# Patient Record
Sex: Female | Born: 1965
Health system: Southern US, Community
[De-identification: ages and names within clinical notes are randomized; demographics above are authoritative.]

## PROBLEM LIST (undated history)

## (undated) DIAGNOSIS — K529 Noninfective gastroenteritis and colitis, unspecified: Secondary | ICD-10-CM

## (undated) DIAGNOSIS — G5751 Tarsal tunnel syndrome, right lower limb: Secondary | ICD-10-CM

## (undated) DIAGNOSIS — E039 Hypothyroidism, unspecified: Secondary | ICD-10-CM

## (undated) DIAGNOSIS — T7840XA Allergy, unspecified, initial encounter: Secondary | ICD-10-CM

## (undated) DIAGNOSIS — E079 Disorder of thyroid, unspecified: Secondary | ICD-10-CM

## (undated) DIAGNOSIS — K52832 Lymphocytic colitis: Secondary | ICD-10-CM

## (undated) DIAGNOSIS — K219 Gastro-esophageal reflux disease without esophagitis: Secondary | ICD-10-CM

## (undated) DIAGNOSIS — C4490 Unspecified malignant neoplasm of skin, unspecified: Secondary | ICD-10-CM

## (undated) HISTORY — PX: TYMPANOPLASTY: SHX33

## (undated) HISTORY — DX: Noninfective gastroenteritis and colitis, unspecified: K52.9

## (undated) HISTORY — PX: UPPER GASTROINTESTINAL ENDOSCOPY: SHX188

## (undated) HISTORY — DX: Disorder of thyroid, unspecified: E07.9

## (undated) HISTORY — PX: SEPTOPLASTY: SUR1290

## (undated) HISTORY — PX: OTHER SURGICAL HISTORY: SHX169

## (undated) HISTORY — DX: Gastro-esophageal reflux disease without esophagitis: K21.9

## (undated) HISTORY — PX: TONSILLECTOMY: SUR1361

## (undated) HISTORY — DX: Tarsal tunnel syndrome, right lower limb: G57.51

## (undated) HISTORY — DX: Allergy, unspecified, initial encounter: T78.40XA

## (undated) HISTORY — DX: Lymphocytic colitis: K52.832

## (undated) HISTORY — DX: Unspecified malignant neoplasm of skin, unspecified: C44.90

---

## 2003-08-15 DIAGNOSIS — C4490 Unspecified malignant neoplasm of skin, unspecified: Secondary | ICD-10-CM

## 2003-08-15 HISTORY — DX: Unspecified malignant neoplasm of skin, unspecified: C44.90

## 2005-05-30 ENCOUNTER — Other Ambulatory Visit: Admission: RE | Admit: 2005-05-30 | Discharge: 2005-05-30 | Payer: Self-pay | Admitting: Obstetrics & Gynecology

## 2005-09-11 ENCOUNTER — Ambulatory Visit: Payer: Self-pay | Admitting: Family Medicine

## 2006-09-17 ENCOUNTER — Ambulatory Visit: Payer: Self-pay | Admitting: Family Medicine

## 2006-11-23 ENCOUNTER — Ambulatory Visit: Payer: Self-pay | Admitting: Family Medicine

## 2007-02-12 ENCOUNTER — Encounter: Admission: RE | Admit: 2007-02-12 | Discharge: 2007-02-12 | Payer: Self-pay | Admitting: Otolaryngology

## 2008-04-14 ENCOUNTER — Ambulatory Visit: Payer: Self-pay | Admitting: Family Medicine

## 2008-04-14 LAB — CONVERTED CEMR LAB
Bilirubin Urine: NEGATIVE
Blood in Urine, dipstick: NEGATIVE
Ketones, urine, test strip: NEGATIVE
Specific Gravity, Urine: 1.01
pH: 7

## 2008-04-15 LAB — CONVERTED CEMR LAB
Bilirubin, Direct: 0.1 mg/dL (ref 0.0–0.3)
Calcium: 8.9 mg/dL (ref 8.4–10.5)
Cholesterol: 188 mg/dL (ref 0–200)
Eosinophils Absolute: 0.1 10*3/uL (ref 0.0–0.7)
GFR calc Af Amer: 118 mL/min
GFR calc non Af Amer: 98 mL/min
Glucose, Bld: 88 mg/dL (ref 70–99)
HCT: 36.9 % (ref 36.0–46.0)
HDL: 46.2 mg/dL (ref >39.0)
Hemoglobin: 12.4 g/dL (ref 12.0–15.0)
MCHC: 33.6 g/dL (ref 30.0–36.0)
MCV: 86.6 fL (ref 78.0–100.0)
Monocytes Absolute: 0.2 10*3/uL (ref 0.1–1.0)
Monocytes Relative: 7.6 % (ref 3.0–12.0)
Neutro Abs: 1.6 10*3/uL (ref 1.4–7.7)
Platelets: 214 10*3/uL (ref 150–400)
Potassium: 3.9 meq/L (ref 3.5–5.1)
RDW: 13 % (ref 11.5–14.6)
Sodium: 144 meq/L (ref 135–145)
TSH: 3.69 microintl units/mL (ref 0.35–5.50)
Triglycerides: 25 mg/dL (ref 0–149)

## 2008-04-17 ENCOUNTER — Ambulatory Visit: Payer: Self-pay | Admitting: Family Medicine

## 2008-04-17 DIAGNOSIS — G500 Trigeminal neuralgia: Secondary | ICD-10-CM

## 2008-04-17 DIAGNOSIS — Z86006 Personal history of melanoma in-situ: Secondary | ICD-10-CM | POA: Insufficient documentation

## 2008-04-17 DIAGNOSIS — M26609 Unspecified temporomandibular joint disorder, unspecified side: Secondary | ICD-10-CM | POA: Insufficient documentation

## 2008-04-21 ENCOUNTER — Ambulatory Visit: Payer: Self-pay | Admitting: Family Medicine

## 2008-04-21 DIAGNOSIS — M545 Low back pain: Secondary | ICD-10-CM

## 2008-04-21 DIAGNOSIS — K5289 Other specified noninfective gastroenteritis and colitis: Secondary | ICD-10-CM | POA: Insufficient documentation

## 2008-11-02 ENCOUNTER — Ambulatory Visit: Payer: Self-pay | Admitting: Family Medicine

## 2008-11-02 DIAGNOSIS — H6690 Otitis media, unspecified, unspecified ear: Secondary | ICD-10-CM | POA: Insufficient documentation

## 2009-05-04 ENCOUNTER — Encounter: Payer: Self-pay | Admitting: Family Medicine

## 2009-09-27 ENCOUNTER — Ambulatory Visit: Payer: Self-pay | Admitting: Gastroenterology

## 2009-09-27 DIAGNOSIS — R1013 Epigastric pain: Secondary | ICD-10-CM

## 2009-09-28 ENCOUNTER — Ambulatory Visit: Payer: Self-pay | Admitting: Gastroenterology

## 2009-09-28 HISTORY — PX: ESOPHAGOGASTRODUODENOSCOPY: SHX1529

## 2009-10-06 ENCOUNTER — Telehealth: Payer: Self-pay | Admitting: Gastroenterology

## 2009-10-06 ENCOUNTER — Encounter: Payer: Self-pay | Admitting: Gastroenterology

## 2009-10-12 ENCOUNTER — Encounter: Payer: Self-pay | Admitting: Gastroenterology

## 2010-08-23 ENCOUNTER — Ambulatory Visit
Admission: RE | Admit: 2010-08-23 | Discharge: 2010-08-23 | Payer: Self-pay | Source: Home / Self Care | Attending: Family Medicine | Admitting: Family Medicine

## 2010-08-23 DIAGNOSIS — K121 Other forms of stomatitis: Secondary | ICD-10-CM | POA: Insufficient documentation

## 2010-08-23 DIAGNOSIS — K1230 Oral mucositis (ulcerative), unspecified: Secondary | ICD-10-CM

## 2010-09-13 NOTE — Letter (Signed)
Summary: EGD Instructions  [facility]  [address] [place], [place] [address]   Phone: [phone]  Fax: [phone]       Patricia Moyer    [birth year]    MRN: [id number]       Procedure Day /Date:TUESDAY 09/28/2009     Arrival Time: 2PM     Procedure Time:3PM     Location of Procedure:                    [facility] (4th Floor)    PREPARATION FOR ENDOSCOPY   On 09/28/2009  THE DAY OF THE PROCEDURE:  1.   No solid foods, milk or milk products are allowed after midnight the night before your procedure.  2.   Do not drink anything colored red or purple.  Avoid juices with pulp.  No orange juice.  3.  You may drink clear liquids until 1PM , which is 2 hours before your procedure.                                                                                                CLEAR LIQUIDS INCLUDE: Water Jello Ice Popsicles Tea (sugar ok, no milk/cream) Powdered fruit flavored drinks Coffee (sugar ok, no milk/cream) Gatorade Juice: apple, white grape, white cranberry  Lemonade Clear bullion, consomm, broth Carbonated beverages (any kind) Strained chicken noodle soup Hard Candy   MEDICATION INSTRUCTIONS  Unless otherwise instructed, you should take regular prescription medications with a small sip of water as early as possible the morning of your procedure.             OTHER INSTRUCTIONS  You will need a responsible adult at least 45 years of age to accompany you and drive you home.   This person must remain in the waiting room during your procedure.  Wear loose fitting clothing that is easily removed.  Leave jewelry and other valuables at home.  However, you may wish to bring a book to read or an iPod/MP3 player to listen to music as you wait for your procedure to start.  Remove all body piercing jewelry and leave at home.  Total time from sign-in until discharge is approximately 2-3 hours.  You should go home directly  after your procedure and rest.  You can resume normal activities the day after your procedure.  The day of your procedure you should not:   Drive   Make legal decisions   Operate machinery   Drink alcohol   Return to work  You will receive specific instructions about eating, activities and medications before you leave.    The above instructions have been reviewed and explained to me by   _______________________    I fully understand and can verbalize these instructions _____________________________ Date _________

## 2010-09-13 NOTE — Medication Information (Signed)
Summary: Prior Authorization for Ac[facility]hex / [facility] [id number]  Prior Authorization for Aciphex / [facility] [id number]   Imported By: Lennie Odor 10/15/2009 15:25:16  _____________________________________________________________________  External Attachment:    Type:   Image     Comment:   External Document

## 2010-09-13 NOTE — Progress Notes (Signed)
Summary: Aciphex  Phone Note Call from Patient Call back at Home Phone [phone]   Caller: Patient Call For: Dr. Arlyce Dice Reason for Call: Refill Medication Summary of Call: pt has tried samples of Aciphex and says it is very helpful... would like rx called in... [facility] [address] in [place] Initial call taken by: Vallarie Mare,  October 06, 2009 9:04 AM  Follow-up for Phone Call        Called pt to inform that med was sent in to her pharmacy electronically today Follow-up by: Merri Ray CMA Duncan Dull),  October 06, 2009 10:36 AM    New/Updated Medications: ACIPHEX 20 MG TBEC (RABEPRAZOLE SODIUM) 1 by mouth once daily 30 minutes before first meal Prescriptions: ACIPHEX 20 MG TBEC (RABEPRAZOLE SODIUM) 1 by mouth once daily 30 minutes before first meal  #30 x 6   Entered by:   Merri Ray CMA (AAMA)   Authorized by:   Louis Meckel MD   Signed by:   Merri Ray CMA (AAMA) on 10/06/2009   Method used:   Electronically to        [id number] (retail)       [address]       [place], [place]  [address]       Ph: [phone]       Fax: [phone]   RxID:   [id number]

## 2010-09-13 NOTE — Assessment & Plan Note (Signed)
Summary: acid reflux...as.   History of Present Illness Visit Type: Initial Visit Primary GI MD: Melvia Heaps MD [facility] Primary Provider: Heath Gold Requesting Provider: n/a Chief Complaint: Heartburn and idigestion History of Present Illness:   [name] is a 45 year old white female referred at the request of Dr. Clent Ridges for evaluation of abdominal pain.  For years she has had pyrosis for which she takes Zantac.  For several weeks she has been complaining of chest and upper abdominal pain.  It tends to occur both at night and in the afternoons and is described as an aching and sometimes burning and sharp pain.  Pain is without radiation.  There is some discomfort to palpation in the right upper quadrant.  She is on no gastric irritants including nonsteroidals.  She denies dysphagia nausea or vomiting.  Symptoms clearly have worsened when she misses several doses of Zantac.   GI Review of Systems    Reports abdominal pain, acid reflux, belching, bloating, chest pain, and  heartburn.      Denies dysphagia with liquids, dysphagia with solids, loss of appetite, nausea, vomiting, vomiting blood, weight loss, and  weight gain.        Denies anal fissure, black tarry stools, change in bowel habit, constipation, diarrhea, diverticulosis, fecal incontinence, heme positive stool, hemorrhoids, irritable bowel syndrome, jaundice, light color stool, liver problems, rectal bleeding, and  rectal pain.    Current Medications (verified): 1)  Zyrtec Allergy 10 Mg  Tabs (Cetirizine Hcl) .Marland Kitchen.. 1 Once Daily Prn 2)  Multivitamins   Tabs (Multiple Vitamin) .Marland Kitchen.. 1 By Mouth Once Daily 3)  Vitamin E 600 Unit  Caps (Vitamin E) .Marland Kitchen.. 1 By Mouth Once Daily 4)  Calcium Carbonate-Vitamin D 600-400 Mg-Unit  Tabs (Calcium Carbonate-Vitamin D) .Marland Kitchen.. 1 By Mouth Once Daily 5)  Glucosamine-Chondroitin   Caps (Glucosamine-Chondroit-Vit C-Mn) .Marland Kitchen.. 1 By Mouth Once Daily 6)  Lysine 500 Mg Caps (Lysine) .... Take 1 Capsule By  Mouth Once A Day 7)  Vitamin C 500 Mg Tabs (Ascorbic Acid) .... Take 1 Tablet By Mouth Once A Day 8)  Ra Vitamin A [address] Unit Caps (Vitamin A) .... Take 1 Capsule By Mouth Once A Day 9)  Potassium Acetate  Crys (Potassium Acetate) .... Take 1 Tablet By Mouth Once A Day  Allergies (verified): 1)  ! Neosporin  Past History:  Past Medical History: Last updated: 04/21/2008 Melanoma, sees Dr. Jorja Loa for skin checks Chickenpox Postpartum depression TMJ sees Dr. Arlyce Dice for gyn  exams  Past Surgical History: Last updated: 04/21/2008 Tonsillectomy deviated septum repair, 10-25-07  per Dr. 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  The patient denies allergy/sinus, anemia, anxiety-new, arthritis/joint pain, back pain, blood in urine, breast changes/lumps, change in vision, confusion, cough, coughing up blood, depression-new, fainting, fatigue, fever, headaches-new, hearing problems, heart murmur, heart rhythm changes, menstrual pain, muscle pains/cramps, night sweats, nosebleeds, pregnancy symptoms, shortness of breath, skin rash, sleeping problems, sore throat, swelling of feet/legs, swollen lymph glands, thirst - excessive , urination - excessive , urination changes/pain, urine leakage, vision changes, and voice change.         All other systems were reviewed and were negative   Vital Signs:  Patient profile:   45 year old female Height:      69 inches Weight:      145  pounds BMI:     21.49 Pulse rate:   72 / minute Pulse rhythm:   regular BP sitting:   112 / 80  (left arm) Cuff size:   regular  Vitals Entered By: Francee Piccolo CMA (AAMA) (September 27, 2009 11:00 AM)  Physical Exam  Additional Exam:  She is a well-developed well-nourished female  skin: anicteric HEENT: normocephalic;  PEERLA; no nasal or pharyngeal abnormalities neck: supple nodes: no cervical lymphadenopathy chest: clear to ausculatation and percussion heart: no murmurs, gallops, or rubs abd: soft, nontender; BS normoactive; no abdominal masses, organomegaly; there is minimal tenderness to palpation in the right subcostal area rectal: deferred ext: no cynanosis, clubbing, edema skeletal: no deformities neuro: oriented x 3; no focal abnormalities    Impression & Recommendations:  Problem # 1:  ABDOMINAL PAIN, EPIGASTRIC (ICD-789.06)  Symptoms could be due to ulcer or nonulcer dyspepsia.  GERD may be primarily responsible for symptoms.  Chronic cholecystitis is less likely.  Recommendations #1 trial of AcipHex 20 mg daily #2 upper endoscopy #3 abdominal ultrasound is symptoms are not improved with AcipHex and endoscopy is not diagnostic  Orders: EGD (EGD)  Patient Instructions: 1)  Conscious Sedation brochure given.  2)  Upper Endoscopy brochure given.

## 2010-09-13 NOTE — Procedures (Signed)
Summary: Upper Endoscopy  Patient: Patricia Moyer Note: All result statuses are Final unless otherwise noted.  Tests: (1) Upper Endoscopy (EGD)   EGD Upper Endoscopy       DONE     [facility]     [address]     [place], [place]  [address]           ENDOSCOPY PROCEDURE REPORT           PATIENT:  Patricia Moyer, Patricia Moyer  MR#:  [id number]     BIRTHDATE:  03/02/1966, 43 yrs. old  GENDER:  female           ENDOSCOPIST:  Barbette Hair. Arlyce Dice, MD     Referred by:  Tera Mater Clent Ridges, M.D.           PROCEDURE DATE:  09/28/2009     PROCEDURE:  EGD, diagnostic     ASA CLASS:  Class I     INDICATIONS:  abdominal pain, GERD           MEDICATIONS:   Fentanyl 50 mcg IV, Versed 5 mg IV, glycopyrrolate     (Robinal) 0.2 mg IV, 0.6cc simethancone 0.6 cc PO     TOPICAL ANESTHETIC:  Exactacain Spray           DESCRIPTION OF PROCEDURE:   After the risks benefits and     alternatives of the procedure were thoroughly explained, informed     consent was obtained.  The LB [id number] endoscope was     introduced through the mouth and advanced to the third portion of     the duodenum, without limitations.  The instrument was slowly     withdrawn as the mucosa was fully examined.     <<PROCEDUREIMAGES>>           The upper, middle, and distal third of the esophagus were     carefully inspected and no abnormalities were noted. The z-line     was well seen at the GEJ. The endoscope was pushed into the fundus     which was normal including a retroflexed view. The antrum,gastric     body, first and second part of the duodenum were unremarkable (see     image1, image2, image3, image4, and image6).    Retroflexed views     revealed no abnormalities.    The scope was then withdrawn from     the patient and the procedure completed.           COMPLICATIONS:  None           ENDOSCOPIC IMPRESSION:     1) Normal EGD     RECOMMENDATIONS:     1) continue PPI - aciphex     2) Call office next 2-3 days to  schedule an office appointment     for 2-3 weeks           REPEAT EXAM:  No           ______________________________     Barbette Hair. Arlyce Dice, MD           CC:           n.     eSIGNED:   Barbette Hair. Kaplan at 09/28/2009 03:29 PM           Doucet, Julian Hy, [id number]  Note: An exclamation mark (!) indicates a result that was not dispersed into the flowsheet. Document Creation Date: 09/28/2009 3:30 PM _______________________________________________________________________  (1) Order result  status: Final Collection or observation date-time: 09/28/2009 15:24 Requested date-time:  Receipt date-time:  Reported date-time:  Referring Physician:   Ordering Physician: Melvia Heaps [phone]) Specimen Source:  Source: Launa Grill Order Number: [id number] Lab site:

## 2010-09-13 NOTE — Miscellaneous (Signed)
Summary: Change to Omeprazole  Clinical Lists Changes  Medications: Changed medication from ACIPHEX 20 MG TBEC (RABEPRAZOLE SODIUM) 1 by mouth once daily 30 minutes before first meal to OMEPRAZOLE 20 MG CPDR (OMEPRAZOLE) 1 by mouth once daily - Signed Rx of OMEPRAZOLE 20 MG CPDR (OMEPRAZOLE) 1 by mouth once daily;  #30 x 2;  Signed;  Entered by: Merri Ray CMA (AAMA);  Authorized by: Louis Meckel MD;  Method used: [facility] to [facility] # [address]*, [address] [place], [place]  [address], Ph: [phone], Fax: [phone]    Prescriptions: OMEPRAZOLE 20 MG CPDR (OMEPRAZOLE) 1 by mouth once daily  #30 x 2   Entered by:   Merri Ray CMA (AAMA)   Authorized by:   Louis Meckel MD   Signed by:   Merri Ray CMA (AAMA) on 10/12/2009   Method used:   Electronically to        [facility] # [id number] (retail)       [address]       [place], [place]  [address]       Ph: [phone]       Fax: [phone]   RxID:   [id number]  Ok to change per Dr Arlyce Dice. Will send request to be scanned in.

## 2010-09-13 NOTE — Letter (Signed)
Summary: Results Letter  [facility]  [address] [place], [place] [address]   Phone: [phone]  Fax: [phone]        September 27, 2009 MRN: [id number]    [facility] [address] [place], [place]  [address]    Dear Ms. Brzoska,  It is my pleasure to have treated you recently as a new patient in my office. I appreciate your confidence and the opportunity to participate in your care.  Since I do have a busy inpatient endoscopy schedule and office schedule, my office hours vary weekly. I am, however, available for emergency calls everyday through my office. If I am not available for an urgent office appointment, another one of our gastroenterologist will be able to assist you.  My well-trained staff are prepared to help you at all times. For emergencies after office hours, a physician from our Gastroenterology section is always available through my 24 hour answering service  Once again I welcome you as a new patient and I look forward to a happy and healthy relationship             Sincerely,  Louis Meckel MD  This letter has been electronically signed by your physician.  Appended Document: Results Letter letter mailed

## 2010-09-15 NOTE — Assessment & Plan Note (Signed)
Summary: MOUTH INF/NJR   Vital Signs:  Patient profile:   45 year old female Weight:      147 pounds O2 Sat:      95 % Temp:     98 degrees F Pulse rate:   78 / minute BP sitting:   114 / 78  (left arm) Cuff size:   regular  Vitals Entered By: Pura Spice, RN (August 23, 2010 8:52 AM) CC: blisters in mouth started 12/21    History of Present Illness: Here for recurrent sores in the mouth with white spots which is worse along the tongue edges and on the cheeks. No ST. This started 2 weeks ago when she started wearing a new retainer from her orthodontist. She stopped wearing this after a few days, and the spots resolved. Then she tried wearing it again, and immediately the spots returned. No recent antibiotics.   Allergies: 1)  ! Neosporin  Past History:  Past Medical History: Reviewed history from 04/21/2008 and no changes required. Melanoma, sees Dr. Jorja Loa for skin checks Chickenpox Postpartum depression TMJ sees Dr. Arlyce Dice for gyn  exams  Review of Systems  The patient denies anorexia, fever, weight loss, weight gain, vision loss, decreased hearing, hoarseness, chest pain, syncope, dyspnea on exertion, peripheral edema, prolonged cough, headaches, hemoptysis, abdominal pain, melena, hematochezia, severe indigestion/heartburn, hematuria, incontinence, genital sores, muscle weakness, suspicious skin lesions, transient blindness, difficulty walking, depression, unusual weight change, abnormal bleeding, enlarged lymph nodes, angioedema, breast masses, and testicular masses.    Physical Exam  General:  Well-developed,well-nourished,in no acute distress; alert,appropriate and cooperative throughout examination Head:  Normocephalic and atraumatic without obvious abnormalities. No apparent alopecia or balding. Eyes:  No corneal or conjunctival inflammation noted. EOMI. Perrla. Funduscopic exam benign, without hemorrhages, exudates or papilledema. Vision grossly normal. Ears:   External ear exam shows no significant lesions or deformities.  Otoscopic examination reveals clear canals, tympanic membranes are intact bilaterally without bulging, retraction, inflammation or discharge. Hearing is grossly normal bilaterally. Nose:  External nasal examination shows no deformity or inflammation. Nasal mucosa are pink and moist without lesions or exudates. Mouth:  Oral mucosa and oropharynx without lesions or exudates.  Teeth in good repair. Neck:  No deformities, masses, or tenderness noted.   Impression & Recommendations:  Problem # 1:  STOMATITIS AND MUCOSITIS UNSPECIFIED (ICD-528.00)  Complete Medication List: 1)  Zyrtec Allergy 10 Mg Tabs (Cetirizine hcl) .Marland Kitchen.. 1 once daily prn 2)  Multivitamins Tabs (Multiple vitamin) .Marland Kitchen.. 1 by mouth once daily 3)  Vitamin E 600 Unit Caps (Vitamin e) .Marland Kitchen.. 1 by mouth once daily 4)  Calcium Carbonate-vitamin D 600-400 Mg-unit Tabs (Calcium carbonate-vitamin d) .Marland Kitchen.. 1 by mouth once daily 5)  Glucosamine-chondroitin Caps (Glucosamine-chondroit-vit c-mn) .Marland Kitchen.. 1 by mouth once daily 6)  Lysine 500 Mg Caps (Lysine) .... Take 1 capsule by mouth once a day 7)  Vitamin C 500 Mg Tabs (Ascorbic acid) .... Take 1 tablet by mouth once a day 8)  Ra Vitamin A [address] Unit Caps (Vitamin a) .... Take 1 capsule by mouth once a day 9)  Potassium Acetate Crys (Potassium acetate) .... Take 1 tablet by mouth once a day 10)  Omeprazole 20 Mg Cpdr (Omeprazole) .Marland Kitchen.. 1 by mouth once daily 11)  Magic Mouthwash  .... Swish 5 cc in mouth q 4 hours as needed  Patient Instructions: 1)  it is unclear what may be going on, but by her descriptions this seems to be thrush. It seems to be triggered  by her new retainer. She will stop wearing this and talk to her orthodontist about this situation.  2)  Please schedule a follow-up appointment as needed .  Prescriptions: MAGIC MOUTHWASH swish 5 cc in mouth q 4 hours as needed  #300 x 2   Entered and Authorized by:   Nelwyn Salisbury MD   Signed by:   Nelwyn Salisbury MD on 08/23/2010   Method used:   Print then Give to Patient   RxID:   [id number]    Orders Added: 1)  Est. Patient Level III [[address]]

## 2010-12-08 ENCOUNTER — Other Ambulatory Visit (INDEPENDENT_AMBULATORY_CARE_PROVIDER_SITE_OTHER): Payer: BC Managed Care – PPO | Admitting: Family Medicine

## 2010-12-08 DIAGNOSIS — Z0000 Encounter for general adult medical examination without abnormal findings: Secondary | ICD-10-CM

## 2010-12-08 LAB — CBC WITH DIFFERENTIAL/PLATELET
Basophils Absolute: 0 10*3/uL (ref 0.0–0.1)
Basophils Relative: 0.8 % (ref 0.0–3.0)
Eosinophils Absolute: 0.3 10*3/uL (ref 0.0–0.7)
Lymphocytes Relative: 27.1 % (ref 12.0–46.0)
MCHC: 33.7 g/dL (ref 30.0–36.0)
Monocytes Absolute: 0.4 10*3/uL (ref 0.1–1.0)
Neutrophils Relative %: 57.6 % (ref 43.0–77.0)
Platelets: 197 10*3/uL (ref 150.0–400.0)
RBC: 4.28 Mil/uL (ref 3.87–5.11)
RDW: 14.6 % (ref 11.5–14.6)

## 2010-12-08 LAB — POCT URINALYSIS DIPSTICK
Blood, UA: NEGATIVE
Nitrite, UA: NEGATIVE
Spec Grav, UA: 1.015
Urobilinogen, UA: 0.2
pH, UA: 6

## 2010-12-08 LAB — LIPID PANEL
HDL: 66 mg/dL (ref >39.00)
LDL Cholesterol: 93 mg/dL (ref 0–99)
Total CHOL/HDL Ratio: 2
Triglycerides: 24 mg/dL (ref 0.0–149.0)
VLDL: 4.8 mg/dL (ref 0.0–40.0)

## 2010-12-08 LAB — BASIC METABOLIC PANEL
BUN: 16 mg/dL (ref 6–23)
CO2: 28 mEq/L (ref 19–32)
Calcium: 8.8 mg/dL (ref 8.4–10.5)
Creatinine, Ser: 0.7 mg/dL (ref 0.4–1.2)

## 2010-12-08 LAB — HEPATIC FUNCTION PANEL
Alkaline Phosphatase: 32 U/L — ABNORMAL LOW (ref 39–117)
Bilirubin, Direct: 0.1 mg/dL (ref 0.0–0.3)
Total Bilirubin: 0.6 mg/dL (ref 0.3–1.2)

## 2010-12-08 LAB — TSH: TSH: 2.24 u[IU]/mL (ref 0.35–5.50)

## 2010-12-14 ENCOUNTER — Encounter: Payer: Self-pay | Admitting: Family Medicine

## 2010-12-14 ENCOUNTER — Ambulatory Visit (INDEPENDENT_AMBULATORY_CARE_PROVIDER_SITE_OTHER): Payer: BC Managed Care – PPO | Admitting: Family Medicine

## 2010-12-14 VITALS — BP 108/72 | HR 71 | Temp 98.7°F | Resp 14 | Ht 69.0 in | Wt 143.6 lb

## 2010-12-14 DIAGNOSIS — Z0000 Encounter for general adult medical examination without abnormal findings: Secondary | ICD-10-CM

## 2010-12-14 DIAGNOSIS — Z136 Encounter for screening for cardiovascular disorders: Secondary | ICD-10-CM

## 2010-12-14 MED ORDER — ALBUTEROL SULFATE HFA 108 (90 BASE) MCG/ACT IN AERS: 2.0000 | INHALATION_SPRAY | RESPIRATORY_TRACT | Status: DC | PRN

## 2010-12-14 NOTE — Progress Notes (Signed)
  Subjective:    Patient ID: Patricia Moyer, female    DOB: 12/18/1965, 45 y.o.   MRN: [id number]  HPI 45 yr old female for a cpx. Her only complaints are about allergies. She takes her Zyrtec daily as usual but she has had some episodes of mild SOB and wheezing lately, especiallt when exercising. No chest pains. Her GERD is well controlled. She works out daily by doing aerobics or walking.    Review of Systems  Constitutional: Negative.   HENT: Negative.   Eyes: Negative.   Respiratory: Negative.   Cardiovascular: Negative.   Gastrointestinal: Negative.   Genitourinary: Negative for dysuria, urgency, frequency, hematuria, flank pain, decreased urine volume, enuresis, difficulty urinating, pelvic pain and dyspareunia.  Musculoskeletal: Negative.   Skin: Negative.   Neurological: Negative.   Hematological: Negative.   Psychiatric/Behavioral: Negative.        Objective:   Physical Exam  Constitutional: She is oriented to person, place, and time. She appears well-developed and well-nourished. No distress.  HENT:  Head: Normocephalic and atraumatic.  Right Ear: External ear normal.  Left Ear: External ear normal.  Nose: Nose normal.  Mouth/Throat: Oropharynx is clear and moist. No oropharyngeal exudate.  Eyes: Conjunctivae and EOM are normal. Pupils are equal, round, and reactive to light. No scleral icterus.  Neck: Normal range of motion. Neck supple. No JVD present. No thyromegaly present.  Cardiovascular: Normal rate, regular rhythm, normal heart sounds and intact distal pulses.  Exam reveals no gallop and no friction rub.   No murmur heard. Pulmonary/Chest: Effort normal and breath sounds normal. No respiratory distress. She has no wheezes. She has no rales. She exhibits no tenderness.  Abdominal: Soft. Bowel sounds are normal. She exhibits no distension and no mass. There is no tenderness. There is no rebound and no guarding.  Musculoskeletal: Normal range of motion. She  exhibits no edema and no tenderness.  Lymphadenopathy:    She has no cervical adenopathy.  Neurological: She is alert and oriented to person, place, and time. She has normal reflexes. No cranial nerve deficit. She exhibits normal muscle tone. Coordination normal.  Skin: Skin is warm and dry. No rash noted. No erythema.  Psychiatric: She has a normal mood and affect. Her behavior is normal. Judgment and thought content normal.          Assessment & Plan:  She has some mild asthma which will likely resolve after the pollen settles down. Try a Proair inhaler when needed.

## 2011-04-19 ENCOUNTER — Ambulatory Visit (INDEPENDENT_AMBULATORY_CARE_PROVIDER_SITE_OTHER): Payer: BC Managed Care – PPO | Admitting: Family Medicine

## 2011-04-19 ENCOUNTER — Encounter: Payer: Self-pay | Admitting: Family Medicine

## 2011-04-19 VITALS — BP 102/70 | HR 74 | Temp 98.5°F | Wt 145.0 lb

## 2011-04-19 DIAGNOSIS — J45909 Unspecified asthma, uncomplicated: Secondary | ICD-10-CM

## 2011-04-19 MED ORDER — OMEPRAZOLE 40 MG PO CPDR: 40.0000 mg | DELAYED_RELEASE_CAPSULE | Freq: Every day | ORAL | Status: DC

## 2011-04-19 MED ORDER — BUDESONIDE-FORMOTEROL FUMARATE 160-4.5 MCG/ACT IN AERO: 2.0000 | INHALATION_SPRAY | Freq: Two times a day (BID) | RESPIRATORY_TRACT | Status: DC

## 2011-04-19 MED ORDER — MAGIC MOUTHWASH: 5.0000 mL | ORAL | Status: DC | PRN

## 2011-04-24 ENCOUNTER — Encounter: Payer: Self-pay | Admitting: Family Medicine

## 2011-04-24 NOTE — Progress Notes (Signed)
  Subjective:    Patient ID: Patricia Moyer, female    DOB: Jun 13, 1966, 45 y.o.   MRN: [id number]  HPI Here to discuss her asthma treatment. She has been using her Proventil almost daily for mild SOB or wheezing. She is active and exercises daily. No coughing. She asks about a maintenance inhaler since her son has done well with his.   Review of Systems  Constitutional: Negative.   Respiratory: Positive for shortness of breath. Negative for cough.   Cardiovascular: Negative.        Objective:   Physical Exam  Constitutional: She appears well-developed and well-nourished.  Neck: No thyromegaly present.  Cardiovascular: Normal rate, regular rhythm, normal heart sounds and intact distal pulses.   Pulmonary/Chest: Effort normal and breath sounds normal. No respiratory distress. She has no wheezes. She has no rales. She exhibits no tenderness.  Lymphadenopathy:    She has no cervical adenopathy.          Assessment & Plan:  Add Symbicort for daily maintenance. Recheck prn

## 2011-06-16 ENCOUNTER — Encounter: Payer: Self-pay | Admitting: Family Medicine

## 2011-06-16 ENCOUNTER — Ambulatory Visit (INDEPENDENT_AMBULATORY_CARE_PROVIDER_SITE_OTHER): Payer: BC Managed Care – PPO | Admitting: Family Medicine

## 2011-06-16 VITALS — BP 110/70 | HR 76 | Temp 98.0°F | Wt 146.0 lb

## 2011-06-16 DIAGNOSIS — J45909 Unspecified asthma, uncomplicated: Secondary | ICD-10-CM

## 2011-06-16 DIAGNOSIS — J309 Allergic rhinitis, unspecified: Secondary | ICD-10-CM

## 2011-06-16 DIAGNOSIS — Z9109 Other allergy status, other than to drugs and biological substances: Secondary | ICD-10-CM

## 2011-06-16 DIAGNOSIS — K219 Gastro-esophageal reflux disease without esophagitis: Secondary | ICD-10-CM

## 2011-06-16 MED ORDER — FEXOFENADINE HCL 180 MG PO TABS: 180.0000 mg | ORAL_TABLET | Freq: Every day | ORAL | Status: DC

## 2011-06-16 MED ORDER — ZOLPIDEM TARTRATE 10 MG PO TABS: 10.0000 mg | ORAL_TABLET | Freq: Every evening | ORAL | Status: AC | PRN

## 2011-06-16 MED ORDER — BUDESONIDE-FORMOTEROL FUMARATE 80-4.5 MCG/ACT IN AERO: 2.0000 | INHALATION_SPRAY | Freq: Two times a day (BID) | RESPIRATORY_TRACT | Status: DC

## 2011-06-16 MED ORDER — FLUTICASONE PROPIONATE 50 MCG/ACT NA SUSP: 2.0000 | Freq: Every day | NASAL | Status: DC

## 2011-06-16 NOTE — Progress Notes (Signed)
  Subjective:    Patient ID: Patricia Moyer, female    DOB: October 04, 1965, 45 y.o.   MRN: [id number]  HPI Here to follow up on allergies and asthma. She is very pleased with Symbicort, and she has less wheezing and less SOB. The Omeprazole has helped her GERD a great deal. She still has a lot of nasal congestion however and a lot of PND. This caused her to clear her throat and cough at times.    Review of Systems  Constitutional: Negative.   HENT: Positive for congestion and postnasal drip.   Eyes: Negative.   Respiratory: Negative.        Objective:   Physical Exam  Constitutional: She appears well-developed and well-nourished.  HENT:  Right Ear: External ear normal.  Left Ear: External ear normal.  Nose: Nose normal.  Mouth/Throat: Oropharyngeal exudate present.  Eyes: Pupils are equal, round, and reactive to light.  Neck: No thyromegaly present.  Pulmonary/Chest: Effort normal and breath sounds normal.  Lymphadenopathy:    She has no cervical adenopathy.          Assessment & Plan:  Decrease Symbicort to 80/4.5 bid. Stay on Omeprazole. Switch from Zyrtec to Allegra daily. Add Flonase

## 2011-08-30 ENCOUNTER — Ambulatory Visit
Admission: RE | Admit: 2011-08-30 | Discharge: 2011-08-30 | Disposition: A | Discharge status: Home / Self Care | Payer: BC Managed Care – PPO | Source: Ambulatory Visit | Attending: Allergy | Admitting: Allergy

## 2011-08-30 ENCOUNTER — Other Ambulatory Visit: Payer: Self-pay | Admitting: Allergy

## 2011-08-30 DIAGNOSIS — J45909 Unspecified asthma, uncomplicated: Secondary | ICD-10-CM

## 2012-04-16 ENCOUNTER — Encounter: Payer: Self-pay | Admitting: Family Medicine

## 2012-04-16 ENCOUNTER — Ambulatory Visit (INDEPENDENT_AMBULATORY_CARE_PROVIDER_SITE_OTHER): Payer: BC Managed Care – PPO | Admitting: Family Medicine

## 2012-04-16 VITALS — BP 102/68 | Temp 98.5°F | Wt 149.0 lb

## 2012-04-16 DIAGNOSIS — J029 Acute pharyngitis, unspecified: Secondary | ICD-10-CM

## 2012-04-16 DIAGNOSIS — B084 Enteroviral vesicular stomatitis with exanthem: Secondary | ICD-10-CM

## 2012-04-16 NOTE — Progress Notes (Signed)
  Subjective:    Patient ID: Patricia Moyer, female    DOB: 1966-05-04, 46 y.o.   MRN: [id number]  HPI Here for 2 days of mild body aches and HA, a bad ST, and some blisters on the lips. No cough or GI symptoms.    Review of Systems  Constitutional: Negative.   HENT: Positive for sore throat and mouth sores. Negative for ear pain, congestion, rhinorrhea, neck pain, neck stiffness and postnasal drip.   Eyes: Negative.   Respiratory: Negative.        Objective:   Physical Exam  Constitutional: She appears well-developed and well-nourished.  HENT:  Right Ear: External ear normal.  Left Ear: External ear normal.  Nose: Nose normal.  Mouth/Throat: No oropharyngeal exudate.       The upper lip has clusters of vesicles on the inside   Eyes: Conjunctivae are normal.  Neck: No thyromegaly present.       Tender shotty AC nodes   Pulmonary/Chest: Effort normal and breath sounds normal.  Skin: No rash noted.          Assessment & Plan:  Rest, fluids, Advil prn

## 2012-04-21 ENCOUNTER — Other Ambulatory Visit: Payer: Self-pay | Admitting: Family Medicine

## 2012-05-29 ENCOUNTER — Telehealth: Payer: Self-pay | Admitting: Gastroenterology

## 2012-05-29 NOTE — Telephone Encounter (Signed)
Pt states she is having a lot of pain and problems with her gallbladder. Pt is requesting to be seen. Pt scheduled to see Dr. Arlyce Dice tomorrow at 9:15am. Pt aware of appt date and time.

## 2012-05-30 ENCOUNTER — Encounter: Payer: Self-pay | Admitting: Gastroenterology

## 2012-05-30 ENCOUNTER — Ambulatory Visit (INDEPENDENT_AMBULATORY_CARE_PROVIDER_SITE_OTHER): Payer: BC Managed Care – PPO | Admitting: Gastroenterology

## 2012-05-30 VITALS — BP 108/70 | HR 75 | Ht 69.0 in | Wt 148.8 lb

## 2012-05-30 DIAGNOSIS — R1011 Right upper quadrant pain: Secondary | ICD-10-CM

## 2012-05-30 DIAGNOSIS — K219 Gastro-esophageal reflux disease without esophagitis: Secondary | ICD-10-CM

## 2012-05-30 DIAGNOSIS — R109 Unspecified abdominal pain: Secondary | ICD-10-CM

## 2012-05-30 MED ORDER — PANTOPRAZOLE SODIUM 40 MG PO TBEC: 40.0000 mg | DELAYED_RELEASE_TABLET | Freq: Every day | ORAL | Status: DC

## 2012-05-30 NOTE — Patient Instructions (Addendum)
Your Abdominal ultrasound is scheduled at [facility] on 06/04/2012 at 8am Nothing to eat or drink after midnight If you need to cancel this appointment call [phone]

## 2012-05-30 NOTE — Progress Notes (Signed)
History of Present Illness: 46 year old white female complaining of pyrosis and right upper quadrant pain. She has a long history of GERD for which she has taking omeprazole in the past. For several weeks she's had worsening  pyrosis throughout the day and particularly at night. She has taken ranitidine without relief. At the same time she has developed pain in the right upper quadrant it often is associated with pyrosis although may persist once the pyrosis has subsided. She feels a temporary hardness in the right upper quadrant it is tender as well. This is intermittent. She is without fever, nausea, vomiting or dysphagia.  Endoscopy in 2011 for similar symptoms was entirely normal.    Past Medical History  Diagnosis Date  . Allergy   . GERD (gastroesophageal reflux disease)    Past Surgical History  Procedure Date  . Esophagogastroduodenoscopy 09-28-09    per Dr. Arlyce Dice, reflux only   . Tonsillectomy   . Septoplasty     per Dr. Suzanna Obey   . Tympanoplasty    family history includes Breast cancer in her maternal grandmother and Diabetes in her maternal grandmother. Current Outpatient Prescriptions  Medication Sig Dispense Refill  . budesonide-formoterol (SYMBICORT) 80-4.5 MCG/ACT inhaler Inhale 2 puffs into the lungs 2 (two) times daily.  1 Inhaler  12  . Calcium Carb-Cholecalciferol 600-400 MG-UNIT TABS Take 1 tablet by mouth daily.        . calcium-vitamin D (OSCAL WITH D) 250-125 MG-UNIT per tablet Take 1 tablet by mouth daily.      . fluticasone (FLONASE) 50 MCG/ACT nasal spray Place 2 sprays into the nose daily.  16 g  11  . montelukast (SINGULAIR) 10 MG tablet Take 10 mg by mouth at bedtime.      Marland Kitchen nystatin (MYCOSTATIN) 100000 UNIT/ML suspension SWISH 1 TEASPOONFUL IN MOUTH AS NEEDED.  300 mL  4  . olopatadine (PATANOL) 0.1 % ophthalmic solution 1 drop 2 (two) times daily.      Marland Kitchen omeprazole (PRILOSEC) 40 MG capsule Take 40 mg by mouth daily.      Marland Kitchen DISCONTD: omeprazole (PRILOSEC)  40 MG capsule Take 1 capsule (40 mg total) by mouth daily.  30 capsule  11  . glucosamine-chondroitin 500-400 MG tablet Take 1 tablet by mouth daily.         Allergies as of 05/30/2012 - Review Complete 05/30/2012  Allergen Reaction Noted  . Neomycin-bacitracin zn-polymyx  04/17/2008  . Vagisil (benzocaine-resorcinol)  12/14/2010    reports that she has never smoked. She has never used smokeless tobacco. She reports that she drinks about 2 ounces of alcohol per week. She reports that she does not use illicit drugs.     Review of Systems: Pertinent positive and negative review of systems were noted in the above HPI section. All other review of systems were otherwise negative.  Vital signs were reviewed in today's medical record Physical Exam: General: Well developed , well nourished, no acute distress Head: Normocephalic and atraumatic Eyes:  sclerae anicteric, EOMI Ears: Normal auditory acuity Mouth: No deformity or lesions Neck: Supple, no masses or thyromegaly Lungs: Clear throughout to auscultation Heart: Regular rate and rhythm; no murmurs, rubs or bruits Abdomen: Soft, and non distended. No masses, hepatosplenomegaly or hernias noted. Normal Bowel sounds. There is minimal midepigastric tenderness Rectal:deferred Musculoskeletal: Symmetrical with no gross deformities  Skin: No lesions on visible extremities Pulses:  Normal pulses noted Extremities: No clubbing, cyanosis, edema or deformities noted Neurological: Alert oriented x 4, grossly nonfocal Cervical  Nodes:  No significant cervical adenopathy Inguinal Nodes: No significant inguinal adenopathy Psychological:  Alert and cooperative. Normal mood and affect

## 2012-05-30 NOTE — Assessment & Plan Note (Addendum)
Patient is symptomatic with GERD.  Recommendations #1 begin protonix 40 mg daily

## 2012-05-30 NOTE — Assessment & Plan Note (Signed)
Pain is most likely related to GERD. Chronic cholecystitis is less likely although a consideration.  Recommendations #1 abdominal ultrasound

## 2012-06-04 ENCOUNTER — Other Ambulatory Visit: Payer: Self-pay | Admitting: Gastroenterology

## 2012-06-04 ENCOUNTER — Ambulatory Visit (HOSPITAL_COMMUNITY)
Admission: RE | Admit: 2012-06-04 | Discharge: 2012-06-04 | Disposition: A | Discharge status: Home / Self Care | Payer: BC Managed Care – PPO | Source: Ambulatory Visit | Attending: Gastroenterology | Admitting: Gastroenterology

## 2012-06-04 ENCOUNTER — Ambulatory Visit (HOSPITAL_COMMUNITY): Payer: BC Managed Care – PPO

## 2012-06-04 DIAGNOSIS — R109 Unspecified abdominal pain: Secondary | ICD-10-CM

## 2012-06-04 DIAGNOSIS — K8020 Calculus of gallbladder without cholecystitis without obstruction: Secondary | ICD-10-CM | POA: Insufficient documentation

## 2012-06-07 ENCOUNTER — Telehealth: Payer: Self-pay | Admitting: *Deleted

## 2012-06-07 DIAGNOSIS — K8020 Calculus of gallbladder without cholecystitis without obstruction: Secondary | ICD-10-CM

## 2012-06-07 NOTE — Telephone Encounter (Signed)
TRIED TO CALL PT BACK WITH TIME AND DATE OF HIDA. NO ANSWER

## 2012-06-07 NOTE — Telephone Encounter (Signed)
Message copied by Marlowe Kays on Fri Jun 07, 2012  4:38 PM ------      Message from: Melvia Heaps D      Created: Fri Jun 07, 2012  2:09 PM       Ultrasound shows gallstones only. If patient is not improved with regard to GI symptoms then I will obtain a HIDA scan. My nurse will contact the patient.

## 2012-06-07 NOTE — Progress Notes (Signed)
Quick Note:  Ultrasound shows gallstones only. If patient is not improved with regard to GI symptoms then I will obtain a HIDA scan. My nurse will contact the patient. ______

## 2012-06-10 ENCOUNTER — Other Ambulatory Visit: Payer: Self-pay | Admitting: Obstetrics & Gynecology

## 2012-06-10 DIAGNOSIS — Z1231 Encounter for screening mammogram for malignant neoplasm of breast: Secondary | ICD-10-CM

## 2012-06-13 NOTE — Telephone Encounter (Signed)
PT AWARE OF HIDA SCAN

## 2012-06-25 ENCOUNTER — Encounter (HOSPITAL_COMMUNITY)
Admission: RE | Admit: 2012-06-25 | Discharge: 2012-06-25 | Disposition: A | Discharge status: Home / Self Care | Payer: BC Managed Care – PPO | Source: Ambulatory Visit | Attending: Gastroenterology | Admitting: Gastroenterology

## 2012-06-25 ENCOUNTER — Encounter (HOSPITAL_COMMUNITY): Payer: Self-pay

## 2012-06-25 DIAGNOSIS — K219 Gastro-esophageal reflux disease without esophagitis: Secondary | ICD-10-CM | POA: Insufficient documentation

## 2012-06-25 DIAGNOSIS — K8020 Calculus of gallbladder without cholecystitis without obstruction: Secondary | ICD-10-CM | POA: Insufficient documentation

## 2012-06-25 MED ORDER — TECHNETIUM TC 99M MEBROFENIN IV KIT
5.5000 | PACK | Freq: Once | INTRAVENOUS | Status: AC | PRN
  Administered 2012-06-25: 5.5 via INTRAVENOUS

## 2012-07-03 ENCOUNTER — Ambulatory Visit (INDEPENDENT_AMBULATORY_CARE_PROVIDER_SITE_OTHER): Payer: BC Managed Care – PPO | Admitting: Gastroenterology

## 2012-07-03 ENCOUNTER — Encounter: Payer: Self-pay | Admitting: Gastroenterology

## 2012-07-03 VITALS — BP 98/80 | HR 64 | Ht 69.0 in | Wt 149.2 lb

## 2012-07-03 DIAGNOSIS — R1011 Right upper quadrant pain: Secondary | ICD-10-CM

## 2012-07-03 DIAGNOSIS — K219 Gastro-esophageal reflux disease without esophagitis: Secondary | ICD-10-CM

## 2012-07-03 NOTE — Progress Notes (Signed)
History of Present Illness:  [name] has returned for followup of abdominal pain and pyrosis. On protonix symptoms have significantly improved. She's had rare episodes of pyrosis and right upper quadrant pain. Ultrasound demonstrated gallbladder stones. HIDA scan was negative.     Review of Systems: Pertinent positive and negative review of systems were noted in the above HPI section. All other review of systems were otherwise negative.    Current Medications, Allergies, Past Medical History, Past Surgical History, Family History and Social History were reviewed in [employer] electronic medical record  Vital signs were reviewed in today's medical record. Physical Exam: General: Well developed , well nourished, no acute distress

## 2012-07-03 NOTE — Patient Instructions (Addendum)
Follow up as needed

## 2012-07-03 NOTE — Assessment & Plan Note (Addendum)
Symptoms are well-controlled with protonix.  She will continue with the same.

## 2012-07-03 NOTE — Assessment & Plan Note (Signed)
Pain has improved. Although she has stones, there is no evidence for acute cholecystitis or chronic cholecystitis by HIDA scan.

## 2012-07-17 ENCOUNTER — Ambulatory Visit
Admission: RE | Admit: 2012-07-17 | Discharge: 2012-07-17 | Disposition: A | Discharge status: Home / Self Care | Payer: BC Managed Care – PPO | Source: Ambulatory Visit | Attending: Obstetrics & Gynecology | Admitting: Obstetrics & Gynecology

## 2012-07-17 DIAGNOSIS — Z1231 Encounter for screening mammogram for malignant neoplasm of breast: Secondary | ICD-10-CM

## 2012-09-04 ENCOUNTER — Encounter: Payer: Self-pay | Admitting: Gastroenterology

## 2012-09-04 ENCOUNTER — Ambulatory Visit (INDEPENDENT_AMBULATORY_CARE_PROVIDER_SITE_OTHER): Payer: BC Managed Care – PPO | Admitting: Gastroenterology

## 2012-09-04 VITALS — BP 110/62 | HR 72 | Ht 69.0 in | Wt 149.0 lb

## 2012-09-04 DIAGNOSIS — K219 Gastro-esophageal reflux disease without esophagitis: Secondary | ICD-10-CM

## 2012-09-04 MED ORDER — DEXLANSOPRAZOLE 60 MG PO CPDR: 60.0000 mg | DELAYED_RELEASE_CAPSULE | Freq: Every day | ORAL | Status: DC

## 2012-09-04 NOTE — Assessment & Plan Note (Signed)
Moderately well-controlled with proton X. She appears to be having side effects, however, from Protonix.  Recommendations #1 trial of dexilant 60 mg one to 2 times a day

## 2012-09-04 NOTE — Progress Notes (Signed)
History of Present Illness:  Pt has returned for followup of GERD. Pyrosis is moderately well controlled. On occasion she has breakthrough pyrosis although, approximately once a week, she will have prolonged episodes of pyrosis. She's on protonix 40mg  daily. Her main complaint is frequent bloating, excess belching and some abdominal discomfort that she associates with Protonix. These symptoms  did not precede protonic.    Review of Systems: Pertinent positive and negative review of systems were noted in the above HPI section. All other review of systems were otherwise negative.    Current Medications, Allergies, Past Medical History, Past Surgical History, Family History and Social History were reviewed in [employer] electronic medical record  Vital signs were reviewed in today's medical record. Physical Exam: General: Well developed , well nourished, no acute distress

## 2012-09-04 NOTE — Patient Instructions (Addendum)
We are sending in 2 weeks worth of Dexilant for you We have given you a discount card

## 2012-09-13 ENCOUNTER — Telehealth: Payer: Self-pay | Admitting: Gastroenterology

## 2012-09-13 DIAGNOSIS — K8020 Calculus of gallbladder without cholecystitis without obstruction: Secondary | ICD-10-CM

## 2012-09-13 NOTE — Telephone Encounter (Signed)
Reviewed. Cholelithiasis, low normal EF on HIDA. Please obtain LFT's, amylase, lipase. Also add Ranitidine 300 mg qhs, #30, 1 refill. Continue Dexilant. Strict low fat diet. May need further studies as per Dr Arlyce Dice.

## 2012-09-13 NOTE — Telephone Encounter (Signed)
Patricia Moyer pt with h/o esophageal reflux. Pt states she is still having a lot of acid/burning on the Dexilant that Dr. Arlyce Moyer placed her on 1/22. She states she could not take protonix or omeprazole either because they did not help. Pt would like to know what else she could try for reflux. Per pt the Dexilant was causing bloating and gas and making her uncomfortable. Dr. Juanda Chance as doc of the day please advise.

## 2012-09-16 MED ORDER — SUCRALFATE 1 G PO TABS: 1.0000 g | ORAL_TABLET | Freq: Four times a day (QID) | ORAL | Status: DC

## 2012-09-16 MED ORDER — RANITIDINE HCL 300 MG PO TABS: 300.0000 mg | ORAL_TABLET | Freq: Every day | ORAL | Status: DC

## 2012-09-16 NOTE — Telephone Encounter (Signed)
Would stop the Dexilant ad try carafate 1 g qid #120 no refill, continue raniditidine  Also start Align 1 each day  Still waiting on the labs Dr. Juanda Chance ordered also

## 2012-09-16 NOTE — Telephone Encounter (Signed)
Spoke with pt and she is aware. Scripts sent to the pharmacy. Pt knows to come for labs, orders in epic.

## 2012-09-16 NOTE — Telephone Encounter (Signed)
Dr. Leone Payor please see the comments below by Dr. Juanda Chance. Pt stated that the Dexilant was making her bloated and she was having a lot of gas which was making her uncomfortable. Should she continue the Dexilant? Please advise as doc of the day.

## 2012-09-20 ENCOUNTER — Other Ambulatory Visit (INDEPENDENT_AMBULATORY_CARE_PROVIDER_SITE_OTHER): Payer: BC Managed Care – PPO

## 2012-09-20 DIAGNOSIS — K8020 Calculus of gallbladder without cholecystitis without obstruction: Secondary | ICD-10-CM

## 2012-09-20 LAB — HEPATIC FUNCTION PANEL
ALT: 17 U/L (ref 0–35)
Albumin: 4.3 g/dL (ref 3.5–5.2)
Total Protein: 7 g/dL (ref 6.0–8.3)

## 2012-09-20 LAB — AMYLASE: Amylase: 92 U/L (ref 27–131)

## 2012-09-20 LAB — LIPASE: Lipase: 26 U/L (ref 11.0–59.0)

## 2012-09-28 ENCOUNTER — Other Ambulatory Visit: Payer: Self-pay

## 2012-10-13 ENCOUNTER — Other Ambulatory Visit: Payer: Self-pay | Admitting: Internal Medicine

## 2012-10-14 NOTE — Telephone Encounter (Signed)
Hello Patricia Moyer,   This is a [employer] features [employer] patient.

## 2013-01-21 ENCOUNTER — Other Ambulatory Visit: Payer: Self-pay | Admitting: Family Medicine

## 2013-01-21 DIAGNOSIS — M722 Plantar fascial fibromatosis: Secondary | ICD-10-CM

## 2013-01-21 DIAGNOSIS — G5751 Tarsal tunnel syndrome, right lower limb: Secondary | ICD-10-CM

## 2013-01-27 ENCOUNTER — Ambulatory Visit
Admission: RE | Admit: 2013-01-27 | Discharge: 2013-01-27 | Disposition: A | Discharge status: Home / Self Care | Payer: BC Managed Care – PPO | Source: Ambulatory Visit | Attending: Family Medicine | Admitting: Family Medicine

## 2013-01-27 ENCOUNTER — Other Ambulatory Visit: Payer: Self-pay | Admitting: Family Medicine

## 2013-01-27 DIAGNOSIS — IMO0002 Reserved for concepts with insufficient information to code with codable children: Secondary | ICD-10-CM

## 2013-01-27 DIAGNOSIS — G5751 Tarsal tunnel syndrome, right lower limb: Secondary | ICD-10-CM

## 2013-01-27 DIAGNOSIS — M722 Plantar fascial fibromatosis: Secondary | ICD-10-CM

## 2013-01-30 ENCOUNTER — Ambulatory Visit
Admission: RE | Admit: 2013-01-30 | Discharge: 2013-01-30 | Disposition: A | Discharge status: Home / Self Care | Payer: BC Managed Care – PPO | Source: Ambulatory Visit | Attending: Family Medicine | Admitting: Family Medicine

## 2013-01-30 DIAGNOSIS — IMO0002 Reserved for concepts with insufficient information to code with codable children: Secondary | ICD-10-CM

## 2013-02-03 ENCOUNTER — Other Ambulatory Visit: Payer: BC Managed Care – PPO

## 2013-02-28 ENCOUNTER — Other Ambulatory Visit: Payer: Self-pay

## 2013-02-28 MED ORDER — SCOPOLAMINE 1 MG/3DAYS TD PT72: 1.0000 | MEDICATED_PATCH | TRANSDERMAL | Status: DC

## 2013-02-28 NOTE — Telephone Encounter (Signed)
Ok per Dr. Clent Ridges to call in scopolamine patch to pharmacy.  Rx called in.

## 2013-03-09 ENCOUNTER — Other Ambulatory Visit: Payer: Self-pay | Admitting: Gastroenterology

## 2013-05-18 ENCOUNTER — Other Ambulatory Visit: Payer: Self-pay | Admitting: Family Medicine

## 2013-05-26 ENCOUNTER — Other Ambulatory Visit: Payer: Self-pay

## 2013-05-26 DIAGNOSIS — Z1231 Encounter for screening mammogram for malignant neoplasm of breast: Secondary | ICD-10-CM

## 2013-06-14 ENCOUNTER — Other Ambulatory Visit: Payer: Self-pay | Admitting: Gastroenterology

## 2013-07-18 ENCOUNTER — Ambulatory Visit
Admission: RE | Admit: 2013-07-18 | Discharge: 2013-07-18 | Disposition: A | Discharge status: Home / Self Care | Payer: BC Managed Care – PPO | Source: Ambulatory Visit

## 2013-07-18 DIAGNOSIS — Z1231 Encounter for screening mammogram for malignant neoplasm of breast: Secondary | ICD-10-CM

## 2013-08-18 ENCOUNTER — Other Ambulatory Visit: Payer: Self-pay | Admitting: Gastroenterology

## 2013-09-17 ENCOUNTER — Other Ambulatory Visit: Payer: Self-pay | Admitting: Gastroenterology

## 2013-11-15 ENCOUNTER — Other Ambulatory Visit: Payer: Self-pay | Admitting: Gastroenterology

## 2014-01-20 ENCOUNTER — Other Ambulatory Visit: Payer: Self-pay | Admitting: Family Medicine

## 2014-03-12 ENCOUNTER — Other Ambulatory Visit (INDEPENDENT_AMBULATORY_CARE_PROVIDER_SITE_OTHER): Payer: BC Managed Care – PPO

## 2014-03-12 DIAGNOSIS — Z0000 Encounter for general adult medical examination without abnormal findings: Secondary | ICD-10-CM

## 2014-03-12 LAB — BASIC METABOLIC PANEL
BUN: 13 mg/dL (ref 6–23)
CHLORIDE: 106 meq/L (ref 96–112)
CO2: 26 meq/L (ref 19–32)
Calcium: 8.7 mg/dL (ref 8.4–10.5)
Creatinine, Ser: 0.8 mg/dL (ref 0.4–1.2)
GFR: 87.62 mL/min (ref >60.00)
GLUCOSE: 74 mg/dL (ref 70–99)
POTASSIUM: 4.3 meq/L (ref 3.5–5.1)
SODIUM: 139 meq/L (ref 135–145)

## 2014-03-12 LAB — POCT URINALYSIS DIPSTICK
Bilirubin, UA: NEGATIVE
Blood, UA: NEGATIVE
GLUCOSE UA: NEGATIVE
KETONES UA: NEGATIVE
Leukocytes, UA: NEGATIVE
Nitrite, UA: NEGATIVE
PROTEIN UA: NEGATIVE
SPEC GRAV UA: 1.01
Urobilinogen, UA: 0.2
pH, UA: 7

## 2014-03-12 LAB — LIPID PANEL
CHOLESTEROL: 175 mg/dL (ref 0–200)
HDL: 66 mg/dL (ref >39.00)
LDL CALC: 102 mg/dL — AB (ref 0–99)
NonHDL: 109
TRIGLYCERIDES: 34 mg/dL (ref 0.0–149.0)
Total CHOL/HDL Ratio: 3
VLDL: 6.8 mg/dL (ref 0.0–40.0)

## 2014-03-12 LAB — CBC WITH DIFFERENTIAL/PLATELET
BASOS ABS: 0 10*3/uL (ref 0.0–0.1)
Basophils Relative: 0.8 % (ref 0.0–3.0)
Eosinophils Absolute: 0.4 10*3/uL (ref 0.0–0.7)
Eosinophils Relative: 10.2 % — ABNORMAL HIGH (ref 0.0–5.0)
HEMATOCRIT: 39.9 % (ref 36.0–46.0)
HEMOGLOBIN: 12.9 g/dL (ref 12.0–15.0)
LYMPHS ABS: 1.3 10*3/uL (ref 0.7–4.0)
Lymphocytes Relative: 37.1 % (ref 12.0–46.0)
MCHC: 32.5 g/dL (ref 30.0–36.0)
MCV: 87.6 fl (ref 78.0–100.0)
MONO ABS: 0.3 10*3/uL (ref 0.1–1.0)
MONOS PCT: 8.4 % (ref 3.0–12.0)
NEUTROS ABS: 1.5 10*3/uL (ref 1.4–7.7)
Neutrophils Relative %: 43.5 % (ref 43.0–77.0)
PLATELETS: 229 10*3/uL (ref 150.0–400.0)
RBC: 4.55 Mil/uL (ref 3.87–5.11)
RDW: 14.2 % (ref 11.5–15.5)
WBC: 3.5 10*3/uL — ABNORMAL LOW (ref 4.0–10.5)

## 2014-03-12 LAB — HEPATIC FUNCTION PANEL
ALT: 14 U/L (ref 0–35)
AST: 17 U/L (ref 0–37)
Albumin: 4.1 g/dL (ref 3.5–5.2)
Alkaline Phosphatase: 37 U/L — ABNORMAL LOW (ref 39–117)
BILIRUBIN TOTAL: 0.6 mg/dL (ref 0.2–1.2)
Bilirubin, Direct: 0.1 mg/dL (ref 0.0–0.3)
TOTAL PROTEIN: 6.7 g/dL (ref 6.0–8.3)

## 2014-03-12 LAB — TSH: TSH: 3.3 u[IU]/mL (ref 0.35–4.50)

## 2014-03-19 ENCOUNTER — Ambulatory Visit (INDEPENDENT_AMBULATORY_CARE_PROVIDER_SITE_OTHER): Payer: BC Managed Care – PPO | Admitting: Family Medicine

## 2014-03-19 ENCOUNTER — Encounter: Payer: Self-pay | Admitting: Family Medicine

## 2014-03-19 VITALS — BP 102/73 | HR 81 | Temp 98.8°F | Ht 69.25 in | Wt 150.0 lb

## 2014-03-19 DIAGNOSIS — Z0000 Encounter for general adult medical examination without abnormal findings: Secondary | ICD-10-CM

## 2014-03-19 DIAGNOSIS — G5751 Tarsal tunnel syndrome, right lower limb: Secondary | ICD-10-CM

## 2014-03-19 DIAGNOSIS — G5750 Tarsal tunnel syndrome, unspecified lower limb: Secondary | ICD-10-CM

## 2014-03-19 MED ORDER — TERBINAFINE HCL 250 MG PO TABS: 250.0000 mg | ORAL_TABLET | Freq: Every day | ORAL | Status: DC

## 2014-03-19 MED ORDER — MAGIC MOUTHWASH W/LIDOCAINE: 5.0000 mL | Freq: Four times a day (QID) | ORAL | Status: DC | PRN

## 2014-03-19 NOTE — Progress Notes (Signed)
   Subjective:    Patient ID: Patricia Moyer, female    DOB: [birth year], 48 y.o.   MRN: [id number]  HPI 48 yr old female for a cpx. She feels well in general but she asks to check her toenails. These have been thickened and yellow for several years. She has been seeing Dr. Sharol Given for tarsal tunnel syndrome in the right foot causing pain in the foot. She is slowly responding to PT and Aleve.    Review of Systems  Constitutional: Negative.   HENT: Negative.   Eyes: Negative.   Respiratory: Negative.   Cardiovascular: Negative.   Gastrointestinal: Negative.   Genitourinary: Negative for dysuria, urgency, frequency, hematuria, flank pain, decreased urine volume, enuresis, difficulty urinating, pelvic pain and dyspareunia.  Musculoskeletal: Negative.   Skin: Negative.   Neurological: Negative.   Psychiatric/Behavioral: Negative.        Objective:   Physical Exam  Constitutional: She is oriented to person, place, and time. She appears well-developed and well-nourished. No distress.  HENT:  Head: Normocephalic and atraumatic.  Right Ear: External ear normal.  Left Ear: External ear normal.  Nose: Nose normal.  Mouth/Throat: Oropharynx is clear and moist. No oropharyngeal exudate.  Eyes: Conjunctivae and EOM are normal. Pupils are equal, round, and reactive to light. No scleral icterus.  Neck: Normal range of motion. Neck supple. No JVD present. No thyromegaly present.  Cardiovascular: Normal rate, regular rhythm, normal heart sounds and intact distal pulses.  Exam reveals no gallop and no friction rub.   No murmur heard. Pulmonary/Chest: Effort normal and breath sounds normal. No respiratory distress. She has no wheezes. She has no rales. She exhibits no tenderness.  Abdominal: Soft. Bowel sounds are normal. She exhibits no distension and no mass. There is no tenderness. There is no rebound and no guarding.  Musculoskeletal: Normal range of motion. She exhibits no edema and no tenderness.   Lymphadenopathy:    She has no cervical adenopathy.  Neurological: She is alert and oriented to person, place, and time. She has normal reflexes. No cranial nerve deficit. She exhibits normal muscle tone. Coordination normal.  Skin: Skin is warm and dry. No rash noted. No erythema.  5 of her toenails show fungal involvement   Psychiatric: She has a normal mood and affect. Her behavior is normal. Judgment and thought content normal.          Assessment & Plan:  Well exam. Try terbinafine for the toenails.

## 2014-03-19 NOTE — Progress Notes (Signed)
Pre visit review using our clinic review tool, if applicable. No additional management support is needed unless otherwise documented below in the visit note. 

## 2014-04-23 ENCOUNTER — Telehealth: Payer: Self-pay | Admitting: Family Medicine

## 2014-04-23 NOTE — Telephone Encounter (Signed)
Pt states the magic mouthwash that dr fry rx her on 8/8 turned her teeth black. Her husband used the mouthwash last night and it turned his tongue  black. Pt would like to know if you think she should take it back to pharnacy or is this a normal side effect?   Pt states this was a new version of magic mouthwash. pls advise

## 2014-04-24 NOTE — Telephone Encounter (Signed)
This is definitely NOT normal and I have never heard of this. They should take it back to the pharmacy and ask them about it

## 2014-04-24 NOTE — Telephone Encounter (Signed)
I left a voice message with below information. 

## 2014-05-28 ENCOUNTER — Other Ambulatory Visit: Payer: Self-pay

## 2014-05-28 DIAGNOSIS — Z1231 Encounter for screening mammogram for malignant neoplasm of breast: Secondary | ICD-10-CM

## 2014-06-23 ENCOUNTER — Ambulatory Visit (INDEPENDENT_AMBULATORY_CARE_PROVIDER_SITE_OTHER): Payer: BC Managed Care – PPO | Admitting: Family Medicine

## 2014-06-23 ENCOUNTER — Encounter: Payer: Self-pay | Admitting: Family Medicine

## 2014-06-23 VITALS — BP 112/74 | HR 73 | Temp 98.5°F | Ht 69.25 in | Wt 150.0 lb

## 2014-06-23 DIAGNOSIS — L309 Dermatitis, unspecified: Secondary | ICD-10-CM

## 2014-06-23 DIAGNOSIS — K589 Irritable bowel syndrome without diarrhea: Secondary | ICD-10-CM

## 2014-06-23 DIAGNOSIS — K219 Gastro-esophageal reflux disease without esophagitis: Secondary | ICD-10-CM

## 2014-06-23 MED ORDER — RANITIDINE HCL 300 MG PO TABS: 300.0000 mg | ORAL_TABLET | Freq: Two times a day (BID) | ORAL | Status: DC

## 2014-06-23 MED ORDER — TRIAMCINOLONE ACETONIDE 0.1 % EX CREA: 1.0000 "application " | TOPICAL_CREAM | Freq: Two times a day (BID) | CUTANEOUS | Status: DC

## 2014-06-23 MED ORDER — DICYCLOMINE HCL 20 MG PO TABS: 20.0000 mg | ORAL_TABLET | Freq: Three times a day (TID) | ORAL | Status: DC

## 2014-06-23 NOTE — Progress Notes (Signed)
   Subjective:    Patient ID: Patricia Moyer, female    DOB: 07-05-1966, 48 y.o.   MRN: [id number]  HPI Here for several things. She has had an itchy scaly patch of skin on the left lower leg for several months. Using OTC moisturizers. Also she has occasional mid abdominal cramps that are often accompanied by GERD symptoms. Her BMs are normal. She currently takes Zantac for a total of 300 mg once a day in the mornings. This works well but she often has GERD sx in the evenings.    Review of Systems  Constitutional: Negative.   Gastrointestinal: Positive for abdominal pain and abdominal distention. Negative for nausea, vomiting, diarrhea, constipation, blood in stool, anal bleeding and rectal pain.  Skin: Positive for rash.       Objective:   Physical Exam  Constitutional: She appears well-developed and well-nourished.  Cardiovascular: Normal rate, regular rhythm, normal heart sounds and intact distal pulses.   Pulmonary/Chest: Effort normal and breath sounds normal.  Abdominal: Soft. Bowel sounds are normal. She exhibits no distension and no mass. There is no tenderness. There is no rebound and no guarding.  Skin:  Patch of macular scaly red skin on the left calf           Assessment & Plan:  Use Triamcinolone cream on the eczema. She seems to have some IBS in addition to GERD. Increase Zantac to 300 mg bid and add dicyclomine prn cramps

## 2014-06-23 NOTE — Progress Notes (Signed)
Pre visit review using our clinic review tool, if applicable. No additional management support is needed unless otherwise documented below in the visit note. 

## 2014-07-20 ENCOUNTER — Ambulatory Visit: Payer: BC Managed Care – PPO

## 2014-08-10 ENCOUNTER — Ambulatory Visit
Admission: RE | Admit: 2014-08-10 | Discharge: 2014-08-10 | Disposition: A | Discharge status: Home / Self Care | Payer: BC Managed Care – PPO | Source: Ambulatory Visit

## 2014-08-10 DIAGNOSIS — Z1231 Encounter for screening mammogram for malignant neoplasm of breast: Secondary | ICD-10-CM

## 2014-08-11 ENCOUNTER — Other Ambulatory Visit: Payer: Self-pay | Admitting: Obstetrics & Gynecology

## 2014-08-11 DIAGNOSIS — R928 Other abnormal and inconclusive findings on diagnostic imaging of breast: Secondary | ICD-10-CM

## 2014-08-24 ENCOUNTER — Other Ambulatory Visit: Payer: BC Managed Care – PPO

## 2014-08-27 ENCOUNTER — Ambulatory Visit
Admission: RE | Admit: 2014-08-27 | Discharge: 2014-08-27 | Disposition: A | Discharge status: Home / Self Care | Payer: BLUE CROSS/BLUE SHIELD | Source: Ambulatory Visit | Attending: Obstetrics & Gynecology | Admitting: Obstetrics & Gynecology

## 2014-08-27 DIAGNOSIS — R928 Other abnormal and inconclusive findings on diagnostic imaging of breast: Secondary | ICD-10-CM

## 2015-03-24 ENCOUNTER — Encounter: Payer: Self-pay | Admitting: Gastroenterology

## 2015-04-27 ENCOUNTER — Ambulatory Visit (INDEPENDENT_AMBULATORY_CARE_PROVIDER_SITE_OTHER): Payer: BLUE CROSS/BLUE SHIELD | Admitting: Family Medicine

## 2015-04-27 DIAGNOSIS — Z23 Encounter for immunization: Secondary | ICD-10-CM

## 2015-06-14 ENCOUNTER — Other Ambulatory Visit: Payer: Self-pay

## 2015-06-14 DIAGNOSIS — Z1231 Encounter for screening mammogram for malignant neoplasm of breast: Secondary | ICD-10-CM

## 2015-08-01 ENCOUNTER — Other Ambulatory Visit: Payer: Self-pay | Admitting: Family Medicine

## 2015-08-12 ENCOUNTER — Ambulatory Visit
Admission: RE | Admit: 2015-08-12 | Discharge: 2015-08-12 | Disposition: A | Discharge status: Home / Self Care | Payer: 59 | Source: Ambulatory Visit

## 2015-08-12 DIAGNOSIS — Z1231 Encounter for screening mammogram for malignant neoplasm of breast: Secondary | ICD-10-CM

## 2015-12-06 ENCOUNTER — Other Ambulatory Visit: Payer: Self-pay | Admitting: Family Medicine

## 2016-01-20 ENCOUNTER — Other Ambulatory Visit (INDEPENDENT_AMBULATORY_CARE_PROVIDER_SITE_OTHER): Payer: 59

## 2016-01-20 DIAGNOSIS — Z0000 Encounter for general adult medical examination without abnormal findings: Secondary | ICD-10-CM | POA: Diagnosis not present

## 2016-01-20 LAB — TSH: TSH: 3.26 u[IU]/mL (ref 0.35–4.50)

## 2016-01-20 LAB — POC URINALSYSI DIPSTICK (AUTOMATED)
Bilirubin, UA: NEGATIVE
Blood, UA: NEGATIVE
GLUCOSE UA: NEGATIVE
KETONES UA: NEGATIVE
LEUKOCYTES UA: NEGATIVE
Nitrite, UA: NEGATIVE
Protein, UA: NEGATIVE
Spec Grav, UA: 1.015
Urobilinogen, UA: 0.2
pH, UA: 8.5

## 2016-01-20 LAB — LIPID PANEL
Cholesterol: 160 mg/dL (ref 0–200)
HDL: 58 mg/dL (ref >39.00)
LDL CALC: 95 mg/dL (ref 0–99)
NonHDL: 102.17
TRIGLYCERIDES: 35 mg/dL (ref 0.0–149.0)
Total CHOL/HDL Ratio: 3
VLDL: 7 mg/dL (ref 0.0–40.0)

## 2016-01-20 LAB — HEPATIC FUNCTION PANEL
ALT: 13 U/L (ref 0–35)
AST: 18 U/L (ref 0–37)
Albumin: 4.5 g/dL (ref 3.5–5.2)
Alkaline Phosphatase: 34 U/L — ABNORMAL LOW (ref 39–117)
Bilirubin, Direct: 0.2 mg/dL (ref 0.0–0.3)
Total Bilirubin: 0.7 mg/dL (ref 0.2–1.2)
Total Protein: 6.6 g/dL (ref 6.0–8.3)

## 2016-01-20 LAB — BASIC METABOLIC PANEL
BUN: 7 mg/dL (ref 6–23)
CALCIUM: 9.2 mg/dL (ref 8.4–10.5)
CHLORIDE: 106 meq/L (ref 96–112)
CO2: 29 meq/L (ref 19–32)
Creatinine, Ser: 0.71 mg/dL (ref 0.40–1.20)
GFR: 92.63 mL/min (ref >60.00)
GLUCOSE: 78 mg/dL (ref 70–99)
POTASSIUM: 4.2 meq/L (ref 3.5–5.1)
Sodium: 140 mEq/L (ref 135–145)

## 2016-01-20 LAB — CBC WITH DIFFERENTIAL/PLATELET
BASOS ABS: 0 10*3/uL (ref 0.0–0.1)
Basophils Relative: 0.9 % (ref 0.0–3.0)
EOS PCT: 7.1 % — AB (ref 0.0–5.0)
Eosinophils Absolute: 0.2 10*3/uL (ref 0.0–0.7)
HCT: 40.2 % (ref 36.0–46.0)
Hemoglobin: 13 g/dL (ref 12.0–15.0)
Lymphocytes Relative: 37.5 % (ref 12.0–46.0)
Lymphs Abs: 1 10*3/uL (ref 0.7–4.0)
MCHC: 32.4 g/dL (ref 30.0–36.0)
MCV: 87 fl (ref 78.0–100.0)
MONO ABS: 0.3 10*3/uL (ref 0.1–1.0)
Monocytes Relative: 9.2 % (ref 3.0–12.0)
Neutro Abs: 1.3 10*3/uL — ABNORMAL LOW (ref 1.4–7.7)
Neutrophils Relative %: 45.3 % (ref 43.0–77.0)
Platelets: 210 10*3/uL (ref 150.0–400.0)
RBC: 4.62 Mil/uL (ref 3.87–5.11)
RDW: 14.2 % (ref 11.5–15.5)
WBC: 2.8 10*3/uL — ABNORMAL LOW (ref 4.0–10.5)

## 2016-01-21 ENCOUNTER — Telehealth: Payer: Self-pay

## 2016-01-21 NOTE — Telephone Encounter (Signed)
-----   Message from Laurey Morale, MD sent at 01/21/2016  8:52 AM EDT ----- Normal

## 2016-01-21 NOTE — Telephone Encounter (Signed)
Please see phone note. Thanks.

## 2016-01-31 ENCOUNTER — Encounter: Payer: Self-pay | Admitting: Family Medicine

## 2016-01-31 ENCOUNTER — Ambulatory Visit (INDEPENDENT_AMBULATORY_CARE_PROVIDER_SITE_OTHER): Payer: 59 | Admitting: Family Medicine

## 2016-01-31 VITALS — BP 109/76 | HR 64 | Temp 97.9°F | Ht 69.25 in | Wt 140.0 lb

## 2016-01-31 DIAGNOSIS — L309 Dermatitis, unspecified: Secondary | ICD-10-CM | POA: Diagnosis not present

## 2016-01-31 DIAGNOSIS — Z0000 Encounter for general adult medical examination without abnormal findings: Secondary | ICD-10-CM | POA: Diagnosis not present

## 2016-01-31 DIAGNOSIS — D72819 Decreased white blood cell count, unspecified: Secondary | ICD-10-CM | POA: Diagnosis not present

## 2016-01-31 MED ORDER — SCOPOLAMINE 1 MG/3DAYS TD PT72: 1.0000 | MEDICATED_PATCH | TRANSDERMAL | Status: DC

## 2016-01-31 MED ORDER — TRIAMCINOLONE ACETONIDE 0.1 % EX CREA
1.0000 | TOPICAL_CREAM | Freq: Two times a day (BID) | CUTANEOUS | Status: DC
Start: 2016-01-31 — End: 2021-01-06

## 2016-01-31 NOTE — Progress Notes (Signed)
Pre visit review using our clinic review tool, if applicable. No additional management support is needed unless otherwise documented below in the visit note. 

## 2016-01-31 NOTE — Progress Notes (Signed)
   Subjective:    Patient ID: Patricia Moyer, female    DOB: 02-05-1966, 50 y.o.   MRN: [id number]  HPI 49 yr old female for a well exam. She feels well in general. She still exercises almost daily. She asks for sea sickness patches since she will be going on a cruise with her family in a few weeks.    Review of Systems  Constitutional: Negative.   HENT: Negative.   Eyes: Negative.   Respiratory: Negative.   Cardiovascular: Negative.   Gastrointestinal: Negative.   Genitourinary: Negative for dysuria, urgency, frequency, hematuria, flank pain, decreased urine volume, enuresis, difficulty urinating, pelvic pain and dyspareunia.  Musculoskeletal: Negative.   Skin: Negative.   Neurological: Negative.   Psychiatric/Behavioral: Negative.        Objective:   Physical Exam  Constitutional: She is oriented to person, place, and time. She appears well-developed and well-nourished. No distress.  HENT:  Head: Normocephalic and atraumatic.  Right Ear: External ear normal.  Left Ear: External ear normal.  Nose: Nose normal.  Mouth/Throat: Oropharynx is clear and moist. No oropharyngeal exudate.  Eyes: Conjunctivae and EOM are normal. Pupils are equal, round, and reactive to light. No scleral icterus.  Neck: Normal range of motion. Neck supple. No JVD present. No thyromegaly present.  Cardiovascular: Normal rate, regular rhythm, normal heart sounds and intact distal pulses.  Exam reveals no gallop and no friction rub.   No murmur heard. Pulmonary/Chest: Effort normal and breath sounds normal. No respiratory distress. She has no wheezes. She has no rales. She exhibits no tenderness.  Abdominal: Soft. Bowel sounds are normal. She exhibits no distension and no mass. There is no tenderness. There is no rebound and no guarding.  Musculoskeletal: Normal range of motion. She exhibits no edema or tenderness.  Lymphadenopathy:    She has no cervical adenopathy.  Neurological: She is alert and  oriented to person, place, and time. She has normal reflexes. No cranial nerve deficit. She exhibits normal muscle tone. Coordination normal.  Skin: Skin is warm and dry. No rash noted. No erythema.  Psychiatric: She has a normal mood and affect. Her behavior is normal. Judgment and thought content normal.          Assessment & Plan:  Well exam. We discussed diet and exercise. Given transdermal scopolamine patches for the cruise. Refer for a colonoscopy. Her WBC has been dropping for a few years and is now down to 2.8 (althought the differential looks okay). We will refer to Hematology to evaluate further.  Laurey Morale, MD

## 2016-02-17 ENCOUNTER — Encounter: Payer: Self-pay | Admitting: Gastroenterology

## 2016-02-28 ENCOUNTER — Ambulatory Visit (HOSPITAL_BASED_OUTPATIENT_CLINIC_OR_DEPARTMENT_OTHER): Payer: 59 | Admitting: Hematology and Oncology

## 2016-02-28 ENCOUNTER — Telehealth: Payer: Self-pay | Admitting: Hematology and Oncology

## 2016-02-28 ENCOUNTER — Encounter: Payer: Self-pay | Admitting: Hematology and Oncology

## 2016-02-28 ENCOUNTER — Ambulatory Visit (HOSPITAL_BASED_OUTPATIENT_CLINIC_OR_DEPARTMENT_OTHER): Payer: 59

## 2016-02-28 VITALS — BP 103/71 | HR 74 | Temp 98.3°F | Resp 18 | Ht 69.25 in | Wt 138.9 lb

## 2016-02-28 DIAGNOSIS — D72819 Decreased white blood cell count, unspecified: Secondary | ICD-10-CM

## 2016-02-28 DIAGNOSIS — Z86006 Personal history of melanoma in-situ: Secondary | ICD-10-CM

## 2016-02-28 DIAGNOSIS — Z85820 Personal history of malignant melanoma of skin: Secondary | ICD-10-CM | POA: Diagnosis not present

## 2016-02-28 LAB — CBC WITH DIFFERENTIAL/PLATELET
BASO%: 1.3 % (ref 0.0–2.0)
BASOS ABS: 0.1 10*3/uL (ref 0.0–0.1)
EOS ABS: 0.1 10*3/uL (ref 0.0–0.5)
EOS%: 2.2 % (ref 0.0–7.0)
HCT: 41.2 % (ref 34.8–46.6)
HGB: 13.3 g/dL (ref 11.6–15.9)
LYMPH%: 32.1 % (ref 14.0–49.7)
MCH: 28.2 pg (ref 25.1–34.0)
MCHC: 32.4 g/dL (ref 31.5–36.0)
MCV: 87.2 fL (ref 79.5–101.0)
MONO#: 0.3 10*3/uL (ref 0.1–0.9)
MONO%: 8.9 % (ref 0.0–14.0)
NEUT%: 55.5 % (ref 38.4–76.8)
NEUTROS ABS: 2.1 10*3/uL (ref 1.5–6.5)
PLATELETS: 230 10*3/uL (ref 145–400)
RBC: 4.72 10*6/uL (ref 3.70–5.45)
RDW: 14.2 % (ref 11.2–14.5)
WBC: 3.8 10*3/uL — ABNORMAL LOW (ref 3.9–10.3)
lymph#: 1.2 10*3/uL (ref 0.9–3.3)

## 2016-02-28 LAB — MORPHOLOGY: PLT EST: ADEQUATE

## 2016-02-28 NOTE — Telephone Encounter (Signed)
Gave pt avs °

## 2016-02-28 NOTE — Progress Notes (Signed)
[facility] NOTE  Patient Care Team: Laurey Morale, MD as PCP - General  CHIEF COMPLAINTS/PURPOSE OF CONSULTATION:  Chronic, intermittent leukopenia routine blood work monitoring by her primary care provider. I have historical record dated back to  HISTORY OF PRESENTING ILLNESS:  Patricia Moyer 50 y.o. female is here because of chronic leukopenia  She was found to have abnormal CBC from  her primary care physician's office. I have historical record dated back to 2009. Her total white blood cell count fluctuated from 2.8-4.6  She denies recent infection. The last prescription antibiotics was carried long time ago and she could not remember when.  There is not reported  recent symptoms of sinus congestion, cough, urinary frequency/urgency or dysuria, diarrhea or joint swelling/pain. She has intermittent skin rash/eczema.  She had no prior history or diagnosis of cancer. Her age appropriate screening programs are up-to-date. The patient has no prior diagnosis of autoimmune disease. She does have history of intermittent allergies and was evaluated by allergist/immunologist and used to take "allergy shots" and antihistamine. She has frequent attacks of sinus drainage, watery eyes and sinus congestion but not recently  She has never received blood transfusion in the past. She has no risk factors for viral transmissions She has prior history of melanoma in situ of the skin was adequately resected   MEDICAL HISTORY:  Past Medical History  Diagnosis Date  . Allergy   . GERD (gastroesophageal reflux disease)   . Tarsal tunnel syndrome of right side     sees Dr. Herbie Drape   . Skin cancer 2005    in situ melanoma back, excision    SURGICAL HISTORY: Past Surgical History  Procedure Laterality Date  . Esophagogastroduodenoscopy  09-28-09    per Dr. Deatra Ina, reflux only   . Tonsillectomy    . Septoplasty      per Dr. Melissa Montane   . Tympanoplasty      SOCIAL  HISTORY: Social History   Social History  . Marital Status: Married    Spouse Name: N/A  . Number of Children: N/A  . Years of Education: N/A   Occupational History  . Not on file.   Social History Main Topics  . Smoking status: Never Smoker   . Smokeless tobacco: Never Used  . Alcohol Use: 2.4 oz/week    4 Standard drinks or equivalent per week     Comment: occ  . Drug Use: No  . Sexual Activity: Not on file     Comment: works as an Forensic psychologist   Other Topics Concern  . Not on file   Social History Narrative    FAMILY HISTORY: Family History  Problem Relation Age of Onset  . Diabetes Maternal Grandmother   . Breast cancer Maternal Grandmother     paternal grandmother    ALLERGIES:  is allergic to neomycin-bacitracin zn-polymyx and vagisil.  MEDICATIONS:  Current Outpatient Prescriptions  Medication Sig Dispense Refill  . Probiotic Product (ALIGN PO) Take by mouth daily.    . ranitidine (ZANTAC) 300 MG tablet TAKE ONE TABLET BY MOUTH TWICE DAILY 60 tablet 8  . triamcinolone cream (KENALOG) 0.1 % Apply 1 application topically 2 (two) times daily. 45 g 5  . Alum & Mag Hydroxide-Simeth (MAGIC MOUTHWASH W/LIDOCAINE) SOLN Take 5 mLs by mouth 4 (four) times daily as needed for mouth pain. (Patient not taking: Reported on 02/28/2016) 300 mL 5  . scopolamine (TRANSDERM-SCOP, 1.5 MG,) 1 MG/3DAYS Place 1 patch (1.5 mg total)  onto the skin every 3 (three) days. (Patient not taking: Reported on 02/28/2016) 10 patch 0   No current facility-administered medications for this visit.    REVIEW OF SYSTEMS:   Constitutional: Denies fevers, chills or abnormal night sweats Eyes: Denies blurriness of vision, double vision or watery eyes Ears, nose, mouth, throat, and face: Denies mucositis or sore throat Respiratory: Denies cough, dyspnea or wheezes Cardiovascular: Denies palpitation, chest discomfort or lower extremity swelling Gastrointestinal:  Denies nausea, heartburn or change in  bowel habits Lymphatics: Denies new lymphadenopathy or easy bruising Neurological:Denies numbness, tingling or new weaknesses Behavioral/Psych: Mood is stable, no new changes  All other systems were reviewed with the patient and are negative.  PHYSICAL EXAMINATION: ECOG PERFORMANCE STATUS: 0 - Asymptomatic  Filed Vitals:   02/28/16 1451  BP: 103/71  Pulse: 74  Temp: 98.3 F (36.8 C)  Resp: 18   Filed Weights   02/28/16 1451  Weight: 138 lb 14.4 oz (63.005 kg)    GENERAL:alert, no distress and comfortable SKIN: skin color, texture, turgor are normal, no rashes or significant lesions EYES: normal, conjunctiva are pink and non-injected, sclera clear OROPHARYNX:no exudate, no erythema and lips, buccal mucosa, and tongue normal  NECK: supple, thyroid normal size, non-tender, without nodularity LYMPH:  no palpable lymphadenopathy in the cervical, axillary or inguinal LUNGS: clear to auscultation and percussion with normal breathing effort HEART: regular rate & rhythm and no murmurs and no lower extremity edema ABDOMEN:abdomen soft, non-tender and normal bowel sounds Musculoskeletal:no cyanosis of digits and no clubbing  PSYCH: alert & oriented x 3 with fluent speech NEURO: no focal motor/sensory deficits  LABORATORY DATA:  I have reviewed the data as listed Recent Results (from the past 2160 hour(s))  Basic metabolic panel     Status: None   Collection Time: 01/20/16  9:24 AM  Result Value Ref Range   Sodium 140 135 - 145 mEq/L   Potassium 4.2 3.5 - 5.1 mEq/L   Chloride 106 96 - 112 mEq/L   CO2 29 19 - 32 mEq/L   Glucose, Bld 78 70 - 99 mg/dL   BUN 7 6 - 23 mg/dL   Creatinine, Ser 0.71 0.40 - 1.20 mg/dL   Calcium 9.2 8.4 - 10.5 mg/dL   GFR 92.63 >60.00 mL/min  CBC with Differential/Platelet     Status: Abnormal   Collection Time: 01/20/16  9:24 AM  Result Value Ref Range   WBC 2.8 (L) 4.0 - 10.5 K/uL   RBC 4.62 3.87 - 5.11 Mil/uL   Hemoglobin 13.0 12.0 - 15.0 g/dL    HCT 40.2 36.0 - 46.0 %   MCV 87.0 78.0 - 100.0 fl   MCHC 32.4 30.0 - 36.0 g/dL   RDW 14.2 11.5 - 15.5 %   Platelets 210.0 150.0 - 400.0 K/uL   Neutrophils Relative % 45.3 43.0 - 77.0 %   Lymphocytes Relative 37.5 12.0 - 46.0 %   Monocytes Relative 9.2 3.0 - 12.0 %   Eosinophils Relative 7.1 (H) 0.0 - 5.0 %   Basophils Relative 0.9 0.0 - 3.0 %   Neutro Abs 1.3 (L) 1.4 - 7.7 K/uL   Lymphs Abs 1.0 0.7 - 4.0 K/uL   Monocytes Absolute 0.3 0.1 - 1.0 K/uL   Eosinophils Absolute 0.2 0.0 - 0.7 K/uL   Basophils Absolute 0.0 0.0 - 0.1 K/uL  Hepatic function panel     Status: Abnormal   Collection Time: 01/20/16  9:24 AM  Result Value Ref Range   Total  Bilirubin 0.7 0.2 - 1.2 mg/dL   Bilirubin, Direct 0.2 0.0 - 0.3 mg/dL   Alkaline Phosphatase 34 (L) 39 - 117 U/L   AST 18 0 - 37 U/L   ALT 13 0 - 35 U/L   Total Protein 6.6 6.0 - 8.3 g/dL   Albumin 4.5 3.5 - 5.2 g/dL  Lipid panel     Status: None   Collection Time: 01/20/16  9:24 AM  Result Value Ref Range   Cholesterol 160 0 - 200 mg/dL    Comment: ATP III Classification       Desirable:  < 200 mg/dL               Borderline High:  200 - 239 mg/dL          High:  > = 240 mg/dL   Triglycerides 35.0 0.0 - 149.0 mg/dL    Comment: Normal:  <150 mg/dLBorderline High:  150 - 199 mg/dL   HDL 58.00 >39.00 mg/dL   VLDL 7.0 0.0 - 40.0 mg/dL   LDL Cholesterol 95 0 - 99 mg/dL   Total CHOL/HDL Ratio 3     Comment:                Men          Women1/2 Average Risk     3.4          3.3Average Risk          5.0          4.42X Average Risk          9.6          7.13X Average Risk          15.0          11.0                       NonHDL 102.17     Comment: NOTE:  Non-HDL goal should be 30 mg/dL higher than patient's LDL goal (i.e. LDL goal of < 70 mg/dL, would have non-HDL goal of < 100 mg/dL)  TSH     Status: None   Collection Time: 01/20/16  9:24 AM  Result Value Ref Range   TSH 3.26 0.35 - 4.50 uIU/mL  POCT Urinalysis Dipstick (Automated)     Status:  None   Collection Time: 01/20/16 10:15 AM  Result Value Ref Range   Color, UA yellow    Clarity, UA clear    Glucose, UA n    Bilirubin, UA n    Ketones, UA n    Spec Grav, UA 1.015    Blood, UA n    pH, UA 8.5    Protein, UA n    Urobilinogen, UA 0.2    Nitrite, UA n    Leukocytes, UA Negative Negative    ASSESSMENT & PLAN  Chronic leukopenia She has chronic, intermittent leukopenia of unknown etiology. I suspect it could be transient bone marrow suppression from stress or profound allergic reaction to her environment. I will order workup to exclude nutritional deficiency, autoimmune disorder, viral infection, etc. I will call her with test results Even with her recent leukopenia, she is barely neutropenic. She is asymptomatic with no complaints of recurrent infection. This can be observed.  History of melanoma in situ The patient is at high risk of skin cancer. She is vigilant with skin exam and skin protection. I reinforced importance of regular follow-up with her dermatologist.  Orders Placed This Encounter  Procedures  . CBC with Differential/Platelet    Standing Status: Future     Number of Occurrences: 1     Standing Expiration Date: 04/03/2017  . Vitamin B12    Standing Status: Future     Number of Occurrences: 1     Standing Expiration Date: 04/03/2017  . Sedimentation rate    Standing Status: Future     Number of Occurrences: 1     Standing Expiration Date: 04/03/2017  . Morphology    Standing Status: Future     Number of Occurrences: 1     Standing Expiration Date: 04/03/2017  . HIV antibody (with reflex)    Standing Status: Future     Number of Occurrences: 1     Standing Expiration Date: 04/03/2017  . ANA, IFA (with reflex)    Standing Status: Future     Number of Occurrences: 1     Standing Expiration Date: 04/03/2017  . Hepatitis C antibody (reflex if positive)    Standing Status: Future     Number of Occurrences: 1     Standing Expiration  Date: 04/03/2017    All questions were answered. The patient knows to call the clinic with any problems, questions or concerns. I spent 30 minutes counseling the patient face to face. The total time spent in the appointment was 40 minutes and more than 50% was on counseling.     [facility], [place], MD 02/28/2016 3:42 PM

## 2016-02-29 LAB — HIV ANTIBODY (ROUTINE TESTING W REFLEX): HIV Screen 4th Generation wRfx: NONREACTIVE

## 2016-02-29 LAB — VITAMIN B12: Vitamin B12: 235 pg/mL (ref 211–946)

## 2016-02-29 LAB — SEDIMENTATION RATE: Sedimentation Rate-Westergren: 2 mm/hr (ref 0–40)

## 2016-02-29 LAB — HEPATITIS C ANTIBODY (REFLEX)

## 2016-02-29 NOTE — Assessment & Plan Note (Signed)
The patient is at high risk of skin cancer. She is vigilant with skin exam and skin protection. I reinforced importance of regular follow-up with her dermatologist.

## 2016-02-29 NOTE — Assessment & Plan Note (Signed)
She has chronic, intermittent leukopenia of unknown etiology. I suspect it could be transient bone marrow suppression from stress or profound allergic reaction to her environment. I will order workup to exclude nutritional deficiency, autoimmune disorder, viral infection, etc. I will call her with test results Even with her recent leukopenia, she is barely neutropenic. She is asymptomatic with no complaints of recurrent infection. This can be observed.

## 2016-03-01 LAB — ANTINUCLEAR ANTIBODIES, IFA: ANTINUCLEAR ANTIBODIES, IFA: NEGATIVE

## 2016-03-03 ENCOUNTER — Telehealth: Payer: Self-pay | Admitting: Hematology and Oncology

## 2016-03-03 NOTE — Telephone Encounter (Signed)
I reviewed the blood test results with the patient over the telephone. Screening tests for autoimmune disorder, HIV and hepatitis C were negative. Blood smear is unremarkable. She is noted to have borderline low B-12 level. I recommend over-the-counter vitamin B-12 supplements. I suspect the cause of her recent acute on chronic leukopenia is likely due to extreme stress. She does not need long-term follow-up here. I would be happy to see her back in the future if she becomes neutropenic again. I recommend routine follow-up with primary care doctor and have CBC checked not more than once a year, unless she has recurrent infection. I addressed all her questions and concerns.

## 2016-04-14 ENCOUNTER — Ambulatory Visit (AMBULATORY_SURGERY_CENTER): Payer: Self-pay | Admitting: *Deleted

## 2016-04-14 ENCOUNTER — Encounter: Payer: Self-pay | Admitting: Gastroenterology

## 2016-04-14 VITALS — Ht 68.5 in | Wt 140.6 lb

## 2016-04-14 DIAGNOSIS — Z1211 Encounter for screening for malignant neoplasm of colon: Secondary | ICD-10-CM

## 2016-04-14 MED ORDER — NA SULFATE-K SULFATE-MG SULF 17.5-3.13-1.6 GM/177ML PO SOLN: 1.0000 | Freq: Once | ORAL | 0 refills | Status: AC

## 2016-04-14 NOTE — Progress Notes (Signed)
No egg or soy allergy known to patient  No issues with past sedation with any surgeries  or procedures, no intubation problems  No diet pills per patient No home 02 use per patient  No blood thinners per patient  Pt denies issues with constipation  No A fib or A flutter   emmi declined'   

## 2016-04-28 ENCOUNTER — Ambulatory Visit (AMBULATORY_SURGERY_CENTER): Payer: 59 | Admitting: Gastroenterology

## 2016-04-28 ENCOUNTER — Encounter: Payer: Self-pay | Admitting: Gastroenterology

## 2016-04-28 VITALS — BP 125/81 | HR 64 | Temp 98.6°F | Resp 12 | Ht 68.5 in | Wt 140.0 lb

## 2016-04-28 DIAGNOSIS — Z1211 Encounter for screening for malignant neoplasm of colon: Secondary | ICD-10-CM | POA: Diagnosis not present

## 2016-04-28 HISTORY — PX: COLONOSCOPY: SHX174

## 2016-04-28 MED ORDER — SODIUM CHLORIDE 0.9 % IV SOLN: 500.0000 mL | INTRAVENOUS | Status: DC

## 2016-04-28 NOTE — Progress Notes (Signed)
To recovery, report to Scott, RN, VSS 

## 2016-04-28 NOTE — Patient Instructions (Signed)
YOU HAD AN ENDOSCOPIC PROCEDURE TODAY AT [facility]:   Refer to the procedure report that was given to you for any specific questions about what was found during the examination.  If the procedure report does not answer your questions, please call your gastroenterologist to clarify.  If you requested that your care partner not be given the details of your procedure findings, then the procedure report has been included in a sealed envelope for you to review at your convenience later.  YOU SHOULD EXPECT: Some feelings of bloating in the abdomen. Passage of more gas than usual.  Walking can help get rid of the air that was put into your GI tract during the procedure and reduce the bloating. If you had a lower endoscopy (such as a colonoscopy or flexible sigmoidoscopy) you may notice spotting of blood in your stool or on the toilet paper. If you underwent a bowel prep for your procedure, you may not have a normal bowel movement for a few days.  Please Note:  You might notice some irritation and congestion in your nose or some drainage.  This is from the oxygen used during your procedure.  There is no need for concern and it should clear up in a day or so.  SYMPTOMS TO REPORT IMMEDIATELY:   Following lower endoscopy (colonoscopy or flexible sigmoidoscopy):  Excessive amounts of blood in the stool  Significant tenderness or worsening of abdominal pains  Swelling of the abdomen that is new, acute  Fever of 100F or higher   Following upper endoscopy (EGD)  Vomiting of blood or coffee ground material  New chest pain or pain under the shoulder blades  Painful or persistently difficult swallowing  New shortness of breath  Fever of 100F or higher  Black, tarry-looking stools  For urgent or emergent issues, a gastroenterologist can be reached at any hour by calling [phone].   DIET:  We do recommend a small meal at first, but then you may proceed to your regular diet.  Drink  plenty of fluids but you should avoid alcoholic beverages for 24 hours.  ACTIVITY:  You should plan to take it easy for the rest of today and you should NOT DRIVE or use heavy machinery until tomorrow (because of the sedation medicines used during the test).    FOLLOW UP: Our staff will call the number listed on your records the next business day following your procedure to check on you and address any questions or concerns that you may have regarding the information given to you following your procedure. If we do not reach you, we will leave a message.  However, if you are feeling well and you are not experiencing any problems, there is no need to return our call.  We will assume that you have returned to your regular daily activities without incident.  If any biopsies were taken you will be contacted by phone or by letter within the next 1-3 weeks.  Please call us at [phone] if you have not heard about the biopsies in 3 weeks.    SIGNATURES/CONFIDENTIALITY: You and/or your care partner have signed paperwork which will be entered into your electronic medical record.  These signatures attest to the fact that that the information above on your After Visit Summary has been reviewed and is understood.  Full responsibility of the confidentiality of this discharge information lies with you and/or your care-partner.  Normal colonoscopy.  Recall 10 years-2027

## 2016-04-28 NOTE — Op Note (Signed)
[place] Patient Name: Patricia Moyer Procedure Date: 04/28/2016 9:48 AM MRN: [id number] Endoscopist: [place]. Loletha Carrow , MD Age: 50 Referring MD:  Date of Birth: [birth year] Gender: Female Account #: [id number] Procedure:                Colonoscopy Indications:              Screening for colorectal malignant neoplasm, This                            is the patient's first colonoscopy Medicines:                Monitored Anesthesia Care Procedure:                Pre-Anesthesia Assessment:                           - Prior to the procedure, a History and Physical                            was performed, and patient medications and                            allergies were reviewed. The patient's tolerance of                            previous anesthesia was also reviewed. The risks                            and benefits of the procedure and the sedation                            options and risks were discussed with the patient.                            All questions were answered, and informed consent                            was obtained. Prior Anticoagulants: The patient has                            taken no previous anticoagulant or antiplatelet                            agents. ASA Grade Assessment: II - A patient with                            mild systemic disease. After reviewing the risks                            and benefits, the patient was deemed in                            satisfactory condition to undergo the procedure.  After obtaining informed consent, the colonoscope                            was passed under direct vision. Throughout the                            procedure, the patient's blood pressure, pulse, and                            oxygen saturations were monitored continuously. The                            Model CF-HQ190L [id number]) scope was introduced                            through the anus and  advanced to the the cecum,                            identified by appendiceal orifice and ileocecal                            valve. The colonoscopy was performed without                            difficulty. The patient tolerated the procedure                            well. The quality of the bowel preparation was                            good. The ileocecal valve, appendiceal orifice, and                            rectum were photographed. The quality of the bowel                            preparation was evaluated using the BBPS [facility]                            Bowel Preparation Scale) with scores of: Right                            Colon = 2, Transverse Colon = 2 and Left Colon = 2.                            The total BBPS score equals 6. The bowel                            preparation used was SUPREP. Scope In: 9:59:16 AM Scope Out: 10:14:10 AM Scope Withdrawal Time: 0 hours 10 minutes 14 seconds  Total Procedure Duration: 0 hours 14 minutes 54 seconds  Findings:                 The perianal  and digital rectal examinations were                            normal.                           The left colon was tortuous.                           The exam was otherwise without abnormality on                            direct and retroflexion views. Complications:            No immediate complications. Estimated Blood Loss:     Estimated blood loss: none. Impression:               - Tortuous colon.                           - The examination was otherwise normal on direct                            and retroflexion views.                           - No specimens collected. Recommendation:           - Patient has a contact number available for                            emergencies. The signs and symptoms of potential                            delayed complications were discussed with the                            patient. Return to normal activities tomorrow.                             Written discharge instructions were provided to the                            patient.                           - Resume previous diet.                           - Continue present medications.                           - Repeat colonoscopy in 10 years for screening                            purposes. [name] L. Loletha Carrow, MD 04/28/2016 10:17:28 AM This report has been signed electronically.

## 2016-05-01 ENCOUNTER — Telehealth: Payer: Self-pay

## 2016-05-01 NOTE — Telephone Encounter (Signed)
  Follow up Call-  Call back number 04/28/2016  Post procedure Call Back phone  # [phone]  Permission to leave phone message Yes  Some recent data might be hidden     Patient was called for follow up after her procedure on 04/28/2016. No answer at the number given for follow up phone call. A message was left on the answering machine.

## 2016-09-09 ENCOUNTER — Other Ambulatory Visit: Payer: Self-pay | Admitting: Family Medicine

## 2016-09-11 NOTE — Telephone Encounter (Signed)
Can we refill this? 

## 2016-09-14 DIAGNOSIS — Z1231 Encounter for screening mammogram for malignant neoplasm of breast: Secondary | ICD-10-CM | POA: Diagnosis not present

## 2016-09-14 DIAGNOSIS — Z01419 Encounter for gynecological examination (general) (routine) without abnormal findings: Secondary | ICD-10-CM | POA: Diagnosis not present

## 2016-09-14 DIAGNOSIS — Z6821 Body mass index (BMI) 21.0-21.9, adult: Secondary | ICD-10-CM | POA: Diagnosis not present

## 2017-03-15 DIAGNOSIS — D2239 Melanocytic nevi of other parts of face: Secondary | ICD-10-CM | POA: Diagnosis not present

## 2017-03-15 DIAGNOSIS — Z86008 Personal history of in-situ neoplasm of other site: Secondary | ICD-10-CM | POA: Diagnosis not present

## 2017-03-15 DIAGNOSIS — D225 Melanocytic nevi of trunk: Secondary | ICD-10-CM | POA: Diagnosis not present

## 2017-03-15 DIAGNOSIS — Z86018 Personal history of other benign neoplasm: Secondary | ICD-10-CM | POA: Diagnosis not present

## 2017-04-20 ENCOUNTER — Telehealth (INDEPENDENT_AMBULATORY_CARE_PROVIDER_SITE_OTHER): Payer: Self-pay

## 2017-04-20 ENCOUNTER — Encounter: Payer: Self-pay | Admitting: Internal Medicine

## 2017-04-20 ENCOUNTER — Ambulatory Visit (INDEPENDENT_AMBULATORY_CARE_PROVIDER_SITE_OTHER): Payer: BLUE CROSS/BLUE SHIELD | Admitting: Internal Medicine

## 2017-04-20 VITALS — BP 110/80 | HR 72 | Temp 98.1°F | Wt 148.2 lb

## 2017-04-20 DIAGNOSIS — R8290 Unspecified abnormal findings in urine: Secondary | ICD-10-CM

## 2017-04-20 DIAGNOSIS — M545 Low back pain: Secondary | ICD-10-CM

## 2017-04-20 LAB — POC URINALSYSI DIPSTICK (AUTOMATED)
BILIRUBIN UA: NEGATIVE
GLUCOSE UA: NEGATIVE
KETONES UA: NEGATIVE
LEUKOCYTES UA: NEGATIVE
Nitrite, UA: NEGATIVE
PH UA: 6.5 (ref 5.0–8.0)
Protein, UA: NEGATIVE
RBC UA: NEGATIVE
Spec Grav, UA: 1.015 (ref 1.010–1.025)
Urobilinogen, UA: 0.2 E.U./dL

## 2017-04-20 NOTE — Telephone Encounter (Signed)
Patient would like a copy of her x-rays that were taken of her back. Patient would like to pick up CD of x-rays on Tuesday.  Advised patient that there is a $5.00 Fee.  Cb# is [phone].  Thank You.

## 2017-04-20 NOTE — Telephone Encounter (Signed)
done

## 2017-04-20 NOTE — Patient Instructions (Signed)
Urine tests is clear today and no evidence of infection  If fever blood or   Urinary pain get back with [place] .

## 2017-04-20 NOTE — Progress Notes (Signed)
Chief Complaint  Patient presents with  . Back Pain    r/o uti    HPI: Patricia Moyer 51 y.o.  SDA  PCP NA    Pain  An strong odor   . Beginning  Monday .  ofor is beter but  Was strong . No fever vomiting .  Wants to make sure not a uti    About 4- 5 in  [place] back problems could be back flare up and saw chiro and said .     No  Blood and no hx kdney stones  Due for  Period soon.  ROS: See pertinent positives and negatives per HPI.  Past Medical History:  Diagnosis Date  . Allergy   . GERD (gastroesophageal reflux disease)   . Skin cancer 2005   in situ melanoma back, excision  . Tarsal tunnel syndrome of right side    sees Dr. Herbie Drape     Family History  Problem Relation Age of Onset  . Diabetes Maternal Grandmother   . Breast cancer Maternal Grandmother        paternal grandmother  . Breast cancer Paternal Grandmother   . Colon cancer Neg Hx   . Colon polyps Neg Hx   . Rectal cancer Neg Hx   . Stomach cancer Neg Hx     Social History   Social History  . Marital status: Married    Spouse name: N/A  . Number of children: N/A  . Years of education: N/A   Social History Main Topics  . Smoking status: Former Research scientist (life sciences)  . Smokeless tobacco: Never Used  . Alcohol use 2.4 oz/week    4 Standard drinks or equivalent per week     Comment: occ  . Drug use: No  . Sexual activity: Not Asked     Comment: works as an Forensic psychologist   Other Topics Concern  . None   Social History Narrative  . None    Outpatient Medications Prior to Visit  Medication Sig Dispense Refill  . ranitidine (ZANTAC) 300 MG tablet TAKE ONE TABLET BY MOUTH TWICE DAILY 60 tablet 11  . triamcinolone cream (KENALOG) 0.1 % Apply 1 application topically 2 (two) times daily. 45 g 5  . Alum & Mag Hydroxide-Simeth (MAGIC MOUTHWASH W/LIDOCAINE) SOLN Take 5 mLs by mouth 4 (four) times daily as needed for mouth pain. (Patient not taking: Reported on 04/20/2017) 300 mL 5   Facility-Administered  Medications Prior to Visit  Medication Dose Route Frequency Provider Last Rate Last Dose  . 0.9 %  sodium chloride infusion  500 mL Intravenous Continuous Danis, Estill Cotta III, MD         EXAM:  BP 110/80 (BP Location: Right Arm, Patient Position: Sitting, Cuff Size: Normal)   Pulse 72   Temp 98.1 F (36.7 C) (Oral)   Wt 148 lb 3.2 oz (67.2 kg)   BMI 22.21 kg/m   Body mass index is 22.21 kg/m.  GENERAL: vitals reviewed and listed above, alert, oriented, appears well hydrated and in no acute distress HEENT: atraumatic, conjunctiva  clear, no obvious abnormalities on inspection of external nose and ears Abdomen:  Sof,t normal bowel sounds without hepatosplenomegaly, no guarding rebound or masses no CVA tenderness Points to left si area as area of pain  PSYCH: pleasant and cooperative, no obvious depression or anxiety UA clear :   ASSESSMENT AND PLAN:  Discussed the following assessment and plan:  Left low back pain, unspecified  chronicity, with sciatica presence unspecified - acute on chronic   Abnormal urine odor - resolved may have been  hydration issue   - Plan: POCT Urinalysis Dipstick (Automated)  -Patient advised to return or notify health care team  if symptoms worsen ,persist or new concerns arise.  Patient Instructions  Urine tests is clear today and no evidence of infection  If fever blood or   Urinary pain get back with [place] .     Standley Brooking. Panosh M.D.

## 2017-04-27 DIAGNOSIS — M9905 Segmental and somatic dysfunction of pelvic region: Secondary | ICD-10-CM | POA: Diagnosis not present

## 2017-04-27 DIAGNOSIS — M47816 Spondylosis without myelopathy or radiculopathy, lumbar region: Secondary | ICD-10-CM | POA: Diagnosis not present

## 2017-04-27 DIAGNOSIS — M9903 Segmental and somatic dysfunction of lumbar region: Secondary | ICD-10-CM | POA: Diagnosis not present

## 2017-04-27 DIAGNOSIS — M47817 Spondylosis without myelopathy or radiculopathy, lumbosacral region: Secondary | ICD-10-CM | POA: Diagnosis not present

## 2017-04-30 DIAGNOSIS — M9905 Segmental and somatic dysfunction of pelvic region: Secondary | ICD-10-CM | POA: Diagnosis not present

## 2017-04-30 DIAGNOSIS — M9903 Segmental and somatic dysfunction of lumbar region: Secondary | ICD-10-CM | POA: Diagnosis not present

## 2017-04-30 DIAGNOSIS — M47817 Spondylosis without myelopathy or radiculopathy, lumbosacral region: Secondary | ICD-10-CM | POA: Diagnosis not present

## 2017-04-30 DIAGNOSIS — M47816 Spondylosis without myelopathy or radiculopathy, lumbar region: Secondary | ICD-10-CM | POA: Diagnosis not present

## 2017-05-03 ENCOUNTER — Encounter: Payer: Self-pay | Admitting: Family Medicine

## 2017-05-03 DIAGNOSIS — M9905 Segmental and somatic dysfunction of pelvic region: Secondary | ICD-10-CM | POA: Diagnosis not present

## 2017-05-03 DIAGNOSIS — M47816 Spondylosis without myelopathy or radiculopathy, lumbar region: Secondary | ICD-10-CM | POA: Diagnosis not present

## 2017-05-03 DIAGNOSIS — M47817 Spondylosis without myelopathy or radiculopathy, lumbosacral region: Secondary | ICD-10-CM | POA: Diagnosis not present

## 2017-05-03 DIAGNOSIS — M9903 Segmental and somatic dysfunction of lumbar region: Secondary | ICD-10-CM | POA: Diagnosis not present

## 2017-05-07 DIAGNOSIS — M47817 Spondylosis without myelopathy or radiculopathy, lumbosacral region: Secondary | ICD-10-CM | POA: Diagnosis not present

## 2017-05-07 DIAGNOSIS — M9905 Segmental and somatic dysfunction of pelvic region: Secondary | ICD-10-CM | POA: Diagnosis not present

## 2017-05-07 DIAGNOSIS — M47816 Spondylosis without myelopathy or radiculopathy, lumbar region: Secondary | ICD-10-CM | POA: Diagnosis not present

## 2017-05-07 DIAGNOSIS — M9903 Segmental and somatic dysfunction of lumbar region: Secondary | ICD-10-CM | POA: Diagnosis not present

## 2017-05-15 DIAGNOSIS — M47817 Spondylosis without myelopathy or radiculopathy, lumbosacral region: Secondary | ICD-10-CM | POA: Diagnosis not present

## 2017-05-15 DIAGNOSIS — M47816 Spondylosis without myelopathy or radiculopathy, lumbar region: Secondary | ICD-10-CM | POA: Diagnosis not present

## 2017-05-15 DIAGNOSIS — M9903 Segmental and somatic dysfunction of lumbar region: Secondary | ICD-10-CM | POA: Diagnosis not present

## 2017-05-15 DIAGNOSIS — M9905 Segmental and somatic dysfunction of pelvic region: Secondary | ICD-10-CM | POA: Diagnosis not present

## 2017-05-17 DIAGNOSIS — M9905 Segmental and somatic dysfunction of pelvic region: Secondary | ICD-10-CM | POA: Diagnosis not present

## 2017-05-17 DIAGNOSIS — M47816 Spondylosis without myelopathy or radiculopathy, lumbar region: Secondary | ICD-10-CM | POA: Diagnosis not present

## 2017-05-17 DIAGNOSIS — M47817 Spondylosis without myelopathy or radiculopathy, lumbosacral region: Secondary | ICD-10-CM | POA: Diagnosis not present

## 2017-05-17 DIAGNOSIS — M9903 Segmental and somatic dysfunction of lumbar region: Secondary | ICD-10-CM | POA: Diagnosis not present

## 2017-05-24 DIAGNOSIS — M9903 Segmental and somatic dysfunction of lumbar region: Secondary | ICD-10-CM | POA: Diagnosis not present

## 2017-05-24 DIAGNOSIS — M47816 Spondylosis without myelopathy or radiculopathy, lumbar region: Secondary | ICD-10-CM | POA: Diagnosis not present

## 2017-05-24 DIAGNOSIS — M9905 Segmental and somatic dysfunction of pelvic region: Secondary | ICD-10-CM | POA: Diagnosis not present

## 2017-05-24 DIAGNOSIS — M47817 Spondylosis without myelopathy or radiculopathy, lumbosacral region: Secondary | ICD-10-CM | POA: Diagnosis not present

## 2017-05-30 DIAGNOSIS — M47816 Spondylosis without myelopathy or radiculopathy, lumbar region: Secondary | ICD-10-CM | POA: Diagnosis not present

## 2017-05-30 DIAGNOSIS — M9905 Segmental and somatic dysfunction of pelvic region: Secondary | ICD-10-CM | POA: Diagnosis not present

## 2017-05-30 DIAGNOSIS — M47817 Spondylosis without myelopathy or radiculopathy, lumbosacral region: Secondary | ICD-10-CM | POA: Diagnosis not present

## 2017-05-30 DIAGNOSIS — M9903 Segmental and somatic dysfunction of lumbar region: Secondary | ICD-10-CM | POA: Diagnosis not present

## 2017-06-06 DIAGNOSIS — M47817 Spondylosis without myelopathy or radiculopathy, lumbosacral region: Secondary | ICD-10-CM | POA: Diagnosis not present

## 2017-06-06 DIAGNOSIS — M47816 Spondylosis without myelopathy or radiculopathy, lumbar region: Secondary | ICD-10-CM | POA: Diagnosis not present

## 2017-06-06 DIAGNOSIS — M9905 Segmental and somatic dysfunction of pelvic region: Secondary | ICD-10-CM | POA: Diagnosis not present

## 2017-06-06 DIAGNOSIS — M9903 Segmental and somatic dysfunction of lumbar region: Secondary | ICD-10-CM | POA: Diagnosis not present

## 2017-06-21 DIAGNOSIS — M9903 Segmental and somatic dysfunction of lumbar region: Secondary | ICD-10-CM | POA: Diagnosis not present

## 2017-06-21 DIAGNOSIS — M9905 Segmental and somatic dysfunction of pelvic region: Secondary | ICD-10-CM | POA: Diagnosis not present

## 2017-06-21 DIAGNOSIS — M47817 Spondylosis without myelopathy or radiculopathy, lumbosacral region: Secondary | ICD-10-CM | POA: Diagnosis not present

## 2017-06-21 DIAGNOSIS — M47816 Spondylosis without myelopathy or radiculopathy, lumbar region: Secondary | ICD-10-CM | POA: Diagnosis not present

## 2017-07-12 DIAGNOSIS — M9903 Segmental and somatic dysfunction of lumbar region: Secondary | ICD-10-CM | POA: Diagnosis not present

## 2017-07-12 DIAGNOSIS — M9905 Segmental and somatic dysfunction of pelvic region: Secondary | ICD-10-CM | POA: Diagnosis not present

## 2017-07-12 DIAGNOSIS — M47816 Spondylosis without myelopathy or radiculopathy, lumbar region: Secondary | ICD-10-CM | POA: Diagnosis not present

## 2017-07-12 DIAGNOSIS — M47817 Spondylosis without myelopathy or radiculopathy, lumbosacral region: Secondary | ICD-10-CM | POA: Diagnosis not present

## 2017-08-01 DIAGNOSIS — M47816 Spondylosis without myelopathy or radiculopathy, lumbar region: Secondary | ICD-10-CM | POA: Diagnosis not present

## 2017-08-01 DIAGNOSIS — M47817 Spondylosis without myelopathy or radiculopathy, lumbosacral region: Secondary | ICD-10-CM | POA: Diagnosis not present

## 2017-08-01 DIAGNOSIS — M9905 Segmental and somatic dysfunction of pelvic region: Secondary | ICD-10-CM | POA: Diagnosis not present

## 2017-08-01 DIAGNOSIS — M9903 Segmental and somatic dysfunction of lumbar region: Secondary | ICD-10-CM | POA: Diagnosis not present

## 2017-08-24 ENCOUNTER — Encounter: Payer: Self-pay | Admitting: Family Medicine

## 2017-08-24 ENCOUNTER — Telehealth: Payer: Self-pay | Admitting: Family Medicine

## 2017-08-24 ENCOUNTER — Ambulatory Visit: Payer: BLUE CROSS/BLUE SHIELD | Admitting: Family Medicine

## 2017-08-24 VITALS — BP 90/60 | HR 85 | Temp 98.1°F | Wt 150.2 lb

## 2017-08-24 DIAGNOSIS — J018 Other acute sinusitis: Secondary | ICD-10-CM

## 2017-08-24 MED ORDER — HYDROCODONE-HOMATROPINE 5-1.5 MG/5ML PO SYRP: 5.0000 mL | ORAL_SOLUTION | ORAL | 0 refills | Status: DC | PRN

## 2017-08-24 MED ORDER — AZITHROMYCIN 250 MG PO TABS: ORAL_TABLET | ORAL | 0 refills | Status: DC

## 2017-08-24 NOTE — Telephone Encounter (Signed)
Called and spoke with pt. Pt advised and voiced understanding.  

## 2017-08-24 NOTE — Telephone Encounter (Signed)
Copied from [place] [id number]. Topic: Quick Communication - See Telephone Encounter >> Aug 24, 2017 12:04 PM Aurelio Brash B wrote: CRM for notification. See Telephone encounter for:  Gerald Stabs [facility] to say [facility] do not have HYDROcodone-homatropine (HYDROMET) 5-1.5 MG/5ML syrup  and wants to know  if you want to send the rx to another pharmacy.  He states  [facility] in the area have it in stock  including [address] . 08/24/17.  Gerald Stabs can be reached at    [phone]

## 2017-08-24 NOTE — Telephone Encounter (Signed)
[facility] [address]

## 2017-08-24 NOTE — Progress Notes (Signed)
   Subjective:    Patient ID: Patricia Moyer, female    DOB: [birth year], 52 y.o.   MRN: [id number]  HPI Here for one week of sinus pressure, headache, PND, ST, and coughing up yeelow sputum. No fever. Using Alka Seltzer Plus.    Review of Systems  Constitutional: Negative.   HENT: Positive for congestion, postnasal drip, sinus pressure, sinus pain and sore throat. Negative for ear pain.   Eyes: Negative.   Respiratory: Positive for cough.        Objective:   Physical Exam  Constitutional: She appears well-developed and well-nourished.  HENT:  Right Ear: External ear normal.  Left Ear: External ear normal.  Nose: Nose normal.  Mouth/Throat: Oropharynx is clear and moist.  Eyes: Conjunctivae are normal.  Neck: No thyromegaly present.  Pulmonary/Chest: Effort normal and breath sounds normal. No respiratory distress. She has no wheezes. She has no rales.  Lymphadenopathy:    She has no cervical adenopathy.          Assessment & Plan:  Sinusitis, treat with a Zpack.  Alysia Penna, MD

## 2017-08-24 NOTE — Telephone Encounter (Signed)
Sent to PCP ?

## 2017-08-29 DIAGNOSIS — M9905 Segmental and somatic dysfunction of pelvic region: Secondary | ICD-10-CM | POA: Diagnosis not present

## 2017-08-29 DIAGNOSIS — M47816 Spondylosis without myelopathy or radiculopathy, lumbar region: Secondary | ICD-10-CM | POA: Diagnosis not present

## 2017-08-29 DIAGNOSIS — M9903 Segmental and somatic dysfunction of lumbar region: Secondary | ICD-10-CM | POA: Diagnosis not present

## 2017-08-29 DIAGNOSIS — M47817 Spondylosis without myelopathy or radiculopathy, lumbosacral region: Secondary | ICD-10-CM | POA: Diagnosis not present

## 2017-09-19 DIAGNOSIS — Z1231 Encounter for screening mammogram for malignant neoplasm of breast: Secondary | ICD-10-CM | POA: Diagnosis not present

## 2017-09-19 DIAGNOSIS — Z6822 Body mass index (BMI) 22.0-22.9, adult: Secondary | ICD-10-CM | POA: Diagnosis not present

## 2017-09-19 DIAGNOSIS — Z01419 Encounter for gynecological examination (general) (routine) without abnormal findings: Secondary | ICD-10-CM | POA: Diagnosis not present

## 2017-09-19 DIAGNOSIS — Z124 Encounter for screening for malignant neoplasm of cervix: Secondary | ICD-10-CM | POA: Diagnosis not present

## 2017-10-04 DIAGNOSIS — M47816 Spondylosis without myelopathy or radiculopathy, lumbar region: Secondary | ICD-10-CM | POA: Diagnosis not present

## 2017-10-04 DIAGNOSIS — M9905 Segmental and somatic dysfunction of pelvic region: Secondary | ICD-10-CM | POA: Diagnosis not present

## 2017-10-04 DIAGNOSIS — M47817 Spondylosis without myelopathy or radiculopathy, lumbosacral region: Secondary | ICD-10-CM | POA: Diagnosis not present

## 2017-10-04 DIAGNOSIS — M9903 Segmental and somatic dysfunction of lumbar region: Secondary | ICD-10-CM | POA: Diagnosis not present

## 2017-10-23 ENCOUNTER — Other Ambulatory Visit: Payer: Self-pay | Admitting: Family Medicine

## 2017-11-01 DIAGNOSIS — M9905 Segmental and somatic dysfunction of pelvic region: Secondary | ICD-10-CM | POA: Diagnosis not present

## 2017-11-01 DIAGNOSIS — M9903 Segmental and somatic dysfunction of lumbar region: Secondary | ICD-10-CM | POA: Diagnosis not present

## 2017-11-01 DIAGNOSIS — M47816 Spondylosis without myelopathy or radiculopathy, lumbar region: Secondary | ICD-10-CM | POA: Diagnosis not present

## 2017-11-01 DIAGNOSIS — M47817 Spondylosis without myelopathy or radiculopathy, lumbosacral region: Secondary | ICD-10-CM | POA: Diagnosis not present

## 2017-11-28 DIAGNOSIS — M9903 Segmental and somatic dysfunction of lumbar region: Secondary | ICD-10-CM | POA: Diagnosis not present

## 2017-11-28 DIAGNOSIS — M47817 Spondylosis without myelopathy or radiculopathy, lumbosacral region: Secondary | ICD-10-CM | POA: Diagnosis not present

## 2017-11-28 DIAGNOSIS — M47816 Spondylosis without myelopathy or radiculopathy, lumbar region: Secondary | ICD-10-CM | POA: Diagnosis not present

## 2017-11-28 DIAGNOSIS — M9905 Segmental and somatic dysfunction of pelvic region: Secondary | ICD-10-CM | POA: Diagnosis not present

## 2017-12-11 ENCOUNTER — Telehealth: Payer: Self-pay | Admitting: Family Medicine

## 2017-12-11 NOTE — Telephone Encounter (Signed)
Copied from [place] [id number]. Topic: Quick Communication - See Telephone Encounter >> Dec 11, 2017  9:48 AM Hewitt Shorts wrote: Pt has questions about getting  the measle booster

## 2017-12-12 NOTE — Telephone Encounter (Signed)
Is measle booster needed?  Pt is concern her and her husband both read articles stating that between certain years it is suggested to get a booster of the Measle vacc. pt stated that her and her husband were born betweem [birth year]. Pt stated that her husband travels a lot and would like to know if Dr. Sarajane Jews feels if this is needed or not if needed they would like to schedule for a nurse visit.   Sent to PCP

## 2017-12-13 NOTE — Telephone Encounter (Signed)
Have them both schedule nurse visits for MMR

## 2017-12-13 NOTE — Telephone Encounter (Signed)
Called and spoke with pt. Pt advised and will call back soon to schedule an appt for her and her husband.

## 2017-12-17 ENCOUNTER — Ambulatory Visit (INDEPENDENT_AMBULATORY_CARE_PROVIDER_SITE_OTHER): Payer: BLUE CROSS/BLUE SHIELD | Admitting: *Deleted

## 2017-12-17 DIAGNOSIS — Z23 Encounter for immunization: Secondary | ICD-10-CM | POA: Diagnosis not present

## 2017-12-17 NOTE — Progress Notes (Signed)
Per orders of Dr. Sarajane Jews, injection of MMR given by Dorrene German. Patient tolerated injection well.

## 2017-12-26 DIAGNOSIS — M47817 Spondylosis without myelopathy or radiculopathy, lumbosacral region: Secondary | ICD-10-CM | POA: Diagnosis not present

## 2017-12-26 DIAGNOSIS — M47816 Spondylosis without myelopathy or radiculopathy, lumbar region: Secondary | ICD-10-CM | POA: Diagnosis not present

## 2017-12-26 DIAGNOSIS — M9903 Segmental and somatic dysfunction of lumbar region: Secondary | ICD-10-CM | POA: Diagnosis not present

## 2017-12-26 DIAGNOSIS — M9905 Segmental and somatic dysfunction of pelvic region: Secondary | ICD-10-CM | POA: Diagnosis not present

## 2018-01-23 DIAGNOSIS — M9903 Segmental and somatic dysfunction of lumbar region: Secondary | ICD-10-CM | POA: Diagnosis not present

## 2018-01-23 DIAGNOSIS — M9905 Segmental and somatic dysfunction of pelvic region: Secondary | ICD-10-CM | POA: Diagnosis not present

## 2018-01-23 DIAGNOSIS — M47816 Spondylosis without myelopathy or radiculopathy, lumbar region: Secondary | ICD-10-CM | POA: Diagnosis not present

## 2018-01-23 DIAGNOSIS — M47817 Spondylosis without myelopathy or radiculopathy, lumbosacral region: Secondary | ICD-10-CM | POA: Diagnosis not present

## 2018-02-13 ENCOUNTER — Encounter: Payer: Self-pay | Admitting: Family Medicine

## 2018-02-13 ENCOUNTER — Ambulatory Visit (INDEPENDENT_AMBULATORY_CARE_PROVIDER_SITE_OTHER): Payer: BLUE CROSS/BLUE SHIELD | Admitting: Family Medicine

## 2018-02-13 VITALS — BP 98/78 | HR 90 | Temp 98.1°F | Ht 68.5 in | Wt 144.8 lb

## 2018-02-13 DIAGNOSIS — N3000 Acute cystitis without hematuria: Secondary | ICD-10-CM

## 2018-02-13 DIAGNOSIS — R300 Dysuria: Secondary | ICD-10-CM | POA: Diagnosis not present

## 2018-02-13 LAB — POC URINALSYSI DIPSTICK (AUTOMATED)
BILIRUBIN UA: NEGATIVE
GLUCOSE UA: NEGATIVE
KETONES UA: NEGATIVE
Nitrite, UA: NEGATIVE
PROTEIN UA: POSITIVE — AB
SPEC GRAV UA: 1.025 (ref 1.010–1.025)
Urobilinogen, UA: 0.2 E.U./dL
pH, UA: 6 (ref 5.0–8.0)

## 2018-02-13 MED ORDER — CIPROFLOXACIN HCL 500 MG PO TABS: 500.0000 mg | ORAL_TABLET | Freq: Two times a day (BID) | ORAL | 0 refills | Status: DC

## 2018-02-13 NOTE — Progress Notes (Signed)
   Subjective:    Patient ID: Patricia Moyer, female    DOB: [birth year], 52 y.o.   MRN: [id number]  HPI Here for 2 days of urinary urgency and burning. No fever. Drinking lots of water.    Review of Systems  Constitutional: Negative.   Respiratory: Negative.   Cardiovascular: Negative.   Gastrointestinal: Negative.   Genitourinary: Positive for dysuria, frequency, pelvic pain and urgency.       Objective:   Physical Exam  Constitutional: She appears well-developed and well-nourished.  Cardiovascular: Normal rate, regular rhythm, normal heart sounds and intact distal pulses.  Pulmonary/Chest: Effort normal and breath sounds normal.  Abdominal: Soft. Bowel sounds are normal. She exhibits no distension and no mass. There is no tenderness. There is no rebound and no guarding.          Assessment & Plan:  UTI, treat with Cipro. Culture the sample.  Alysia Penna, MD

## 2018-02-15 LAB — URINE CULTURE
MICRO NUMBER:: 90793431
SPECIMEN QUALITY:: ADEQUATE

## 2018-02-18 ENCOUNTER — Telehealth: Payer: Self-pay | Admitting: Family Medicine

## 2018-02-18 NOTE — Telephone Encounter (Signed)
Returning call Copied from [place] [id number]. Topic: Quick Communication - Lab Results >> Feb 15, 2018  4:40 PM Agnes Lawrence, CMA wrote: Called patient to inform them of lab results. When patient returns call, triage nurse may disclose results.

## 2018-02-18 NOTE — Telephone Encounter (Signed)
Pt given lab results and documented in result note.  

## 2018-02-20 DIAGNOSIS — M9903 Segmental and somatic dysfunction of lumbar region: Secondary | ICD-10-CM | POA: Diagnosis not present

## 2018-02-20 DIAGNOSIS — M47816 Spondylosis without myelopathy or radiculopathy, lumbar region: Secondary | ICD-10-CM | POA: Diagnosis not present

## 2018-04-03 DIAGNOSIS — M9905 Segmental and somatic dysfunction of pelvic region: Secondary | ICD-10-CM | POA: Diagnosis not present

## 2018-04-03 DIAGNOSIS — M47817 Spondylosis without myelopathy or radiculopathy, lumbosacral region: Secondary | ICD-10-CM | POA: Diagnosis not present

## 2018-04-03 DIAGNOSIS — M47816 Spondylosis without myelopathy or radiculopathy, lumbar region: Secondary | ICD-10-CM | POA: Diagnosis not present

## 2018-04-10 DIAGNOSIS — Z86018 Personal history of other benign neoplasm: Secondary | ICD-10-CM | POA: Diagnosis not present

## 2018-04-10 DIAGNOSIS — D225 Melanocytic nevi of trunk: Secondary | ICD-10-CM | POA: Diagnosis not present

## 2018-04-10 DIAGNOSIS — D2261 Melanocytic nevi of right upper limb, including shoulder: Secondary | ICD-10-CM | POA: Diagnosis not present

## 2018-04-10 DIAGNOSIS — Z87898 Personal history of other specified conditions: Secondary | ICD-10-CM | POA: Diagnosis not present

## 2018-05-01 DIAGNOSIS — M9903 Segmental and somatic dysfunction of lumbar region: Secondary | ICD-10-CM | POA: Diagnosis not present

## 2018-05-01 DIAGNOSIS — M47816 Spondylosis without myelopathy or radiculopathy, lumbar region: Secondary | ICD-10-CM | POA: Diagnosis not present

## 2018-05-01 DIAGNOSIS — M9905 Segmental and somatic dysfunction of pelvic region: Secondary | ICD-10-CM | POA: Diagnosis not present

## 2018-05-01 DIAGNOSIS — M47817 Spondylosis without myelopathy or radiculopathy, lumbosacral region: Secondary | ICD-10-CM | POA: Diagnosis not present

## 2018-05-29 DIAGNOSIS — M47816 Spondylosis without myelopathy or radiculopathy, lumbar region: Secondary | ICD-10-CM | POA: Diagnosis not present

## 2018-05-29 DIAGNOSIS — M9905 Segmental and somatic dysfunction of pelvic region: Secondary | ICD-10-CM | POA: Diagnosis not present

## 2018-05-29 DIAGNOSIS — M47817 Spondylosis without myelopathy or radiculopathy, lumbosacral region: Secondary | ICD-10-CM | POA: Diagnosis not present

## 2018-05-29 DIAGNOSIS — M9903 Segmental and somatic dysfunction of lumbar region: Secondary | ICD-10-CM | POA: Diagnosis not present

## 2018-06-17 ENCOUNTER — Encounter: Payer: Self-pay | Admitting: Family Medicine

## 2018-06-17 ENCOUNTER — Ambulatory Visit: Payer: BLUE CROSS/BLUE SHIELD | Admitting: Family Medicine

## 2018-06-17 VITALS — BP 110/68 | HR 70 | Temp 98.5°F | Wt 149.6 lb

## 2018-06-17 DIAGNOSIS — R51 Headache: Secondary | ICD-10-CM | POA: Diagnosis not present

## 2018-06-17 DIAGNOSIS — R519 Headache, unspecified: Secondary | ICD-10-CM

## 2018-06-17 DIAGNOSIS — G8929 Other chronic pain: Secondary | ICD-10-CM

## 2018-06-17 NOTE — Progress Notes (Signed)
   Subjective:    Patient ID: Patricia Moyer, female    DOB: 10-12-1965, 52 y.o.   MRN: [id number]  HPI Here for a headache that started 2 months ago. No recent head trauma. The pain comes and goes but is present every day, more so in the afternoons. This is centered above and below the left eye. No nausea or vision changes. No light sensitivity. She has chronic congestion from allergies but she denies any ST or fever or cough. She is not blowing any mucus from the nose. She takes Zyrtec daily and uses a Netty pot daily.    Review of Systems  Constitutional: Negative.   Respiratory: Negative.   Cardiovascular: Negative.   Neurological: Positive for headaches. Negative for dizziness, tremors, seizures, syncope, facial asymmetry, speech difficulty, weakness, light-headedness and numbness.       Objective:   Physical Exam  Constitutional: She is oriented to person, place, and time. She appears well-developed and well-nourished. No distress.  HENT:  Head: Normocephalic and atraumatic.  Right Ear: External ear normal.  Left Ear: External ear normal.  Nose: Nose normal.  Mouth/Throat: Oropharynx is clear and moist.  Eyes: Pupils are equal, round, and reactive to light. Conjunctivae and EOM are normal.  Neck: Neck supple. No thyromegaly present.  Cardiovascular: Normal rate, regular rhythm, normal heart sounds and intact distal pulses.  Pulmonary/Chest: Effort normal and breath sounds normal.  Lymphadenopathy:    She has no cervical adenopathy.  Neurological: She is alert and oriented to person, place, and time. No cranial nerve deficit. She exhibits normal muscle tone. Coordination normal.          Assessment & Plan:  Chronic headache, likely related to sinus congestion. Keep using the Netty pot. Change to Zyrtec D daily and add Flonase sprays daily. Set up a head CT to rule out structural issues.  Alysia Penna, MD

## 2018-06-26 DIAGNOSIS — M47817 Spondylosis without myelopathy or radiculopathy, lumbosacral region: Secondary | ICD-10-CM | POA: Diagnosis not present

## 2018-06-26 DIAGNOSIS — M47816 Spondylosis without myelopathy or radiculopathy, lumbar region: Secondary | ICD-10-CM | POA: Diagnosis not present

## 2018-06-26 DIAGNOSIS — M9903 Segmental and somatic dysfunction of lumbar region: Secondary | ICD-10-CM | POA: Diagnosis not present

## 2018-06-26 DIAGNOSIS — M9905 Segmental and somatic dysfunction of pelvic region: Secondary | ICD-10-CM | POA: Diagnosis not present

## 2018-07-04 ENCOUNTER — Ambulatory Visit (INDEPENDENT_AMBULATORY_CARE_PROVIDER_SITE_OTHER)
Admission: RE | Admit: 2018-07-04 | Discharge: 2018-07-04 | Disposition: A | Discharge status: Home / Self Care | Payer: BLUE CROSS/BLUE SHIELD | Source: Ambulatory Visit | Attending: Family Medicine | Admitting: Family Medicine

## 2018-07-04 DIAGNOSIS — R51 Headache: Secondary | ICD-10-CM | POA: Diagnosis not present

## 2018-07-04 DIAGNOSIS — R519 Headache, unspecified: Secondary | ICD-10-CM

## 2018-07-05 ENCOUNTER — Encounter: Payer: Self-pay | Admitting: *Deleted

## 2018-07-24 DIAGNOSIS — M9903 Segmental and somatic dysfunction of lumbar region: Secondary | ICD-10-CM | POA: Diagnosis not present

## 2018-07-24 DIAGNOSIS — M47816 Spondylosis without myelopathy or radiculopathy, lumbar region: Secondary | ICD-10-CM | POA: Diagnosis not present

## 2018-07-24 DIAGNOSIS — M9905 Segmental and somatic dysfunction of pelvic region: Secondary | ICD-10-CM | POA: Diagnosis not present

## 2018-07-24 DIAGNOSIS — M47817 Spondylosis without myelopathy or radiculopathy, lumbosacral region: Secondary | ICD-10-CM | POA: Diagnosis not present

## 2018-07-29 ENCOUNTER — Ambulatory Visit: Payer: BLUE CROSS/BLUE SHIELD | Admitting: Family Medicine

## 2018-07-29 ENCOUNTER — Encounter: Payer: Self-pay | Admitting: Family Medicine

## 2018-07-29 VITALS — BP 120/84 | HR 76 | Temp 98.2°F | Wt 153.1 lb

## 2018-07-29 DIAGNOSIS — G500 Trigeminal neuralgia: Secondary | ICD-10-CM

## 2018-07-29 MED ORDER — METHYLPREDNISOLONE 4 MG PO TBPK: ORAL_TABLET | ORAL | 0 refills | Status: DC

## 2018-07-29 NOTE — Progress Notes (Signed)
   Subjective:    Patient ID: Patricia Moyer, female    DOB: [birth year], 52 y.o.   MRN: [id number]  HPI Here to follow up on intermittent daily headaches that are centered above and below the left eye. No sinus congestion or PND or ST. No vision changes. She is using Flonase and Zyrtec with no relief. She had a normal head CT on 07-04-18. These started about 3 months ago.    Review of Systems  Constitutional: Negative.   Eyes: Negative.   Respiratory: Negative.   Neurological: Positive for headaches.       Objective:   Physical Exam Constitutional:      Appearance: She is well-developed.  HENT:     Head: Normocephalic and atraumatic.     Right Ear: Tympanic membrane and ear canal normal.     Left Ear: Tympanic membrane and ear canal normal.     Nose: Nose normal.     Mouth/Throat:     Mouth: Mucous membranes are moist.     Pharynx: Oropharynx is clear.  Eyes:     Conjunctiva/sclera: Conjunctivae normal.     Pupils: Pupils are equal, round, and reactive to light.  Neck:     Musculoskeletal: Normal range of motion.  Pulmonary:     Effort: Pulmonary effort is normal.     Breath sounds: Normal breath sounds.  Lymphadenopathy:     Cervical: No cervical adenopathy.  Neurological:     General: No focal deficit present.     Mental Status: She is alert and oriented to person, place, and time.     Cranial Nerves: No cranial nerve deficit.           Assessment & Plan:  This may represent a trigeminal neuralgia. She will try a Medrol dose pack.  Alysia Penna, MD

## 2018-08-21 DIAGNOSIS — M47816 Spondylosis without myelopathy or radiculopathy, lumbar region: Secondary | ICD-10-CM | POA: Diagnosis not present

## 2018-08-21 DIAGNOSIS — M9905 Segmental and somatic dysfunction of pelvic region: Secondary | ICD-10-CM | POA: Diagnosis not present

## 2018-08-21 DIAGNOSIS — M9903 Segmental and somatic dysfunction of lumbar region: Secondary | ICD-10-CM | POA: Diagnosis not present

## 2018-08-21 DIAGNOSIS — M47817 Spondylosis without myelopathy or radiculopathy, lumbosacral region: Secondary | ICD-10-CM | POA: Diagnosis not present

## 2018-09-18 DIAGNOSIS — M9903 Segmental and somatic dysfunction of lumbar region: Secondary | ICD-10-CM | POA: Diagnosis not present

## 2018-09-18 DIAGNOSIS — M9905 Segmental and somatic dysfunction of pelvic region: Secondary | ICD-10-CM | POA: Diagnosis not present

## 2018-09-18 DIAGNOSIS — M47817 Spondylosis without myelopathy or radiculopathy, lumbosacral region: Secondary | ICD-10-CM | POA: Diagnosis not present

## 2018-09-18 DIAGNOSIS — M47816 Spondylosis without myelopathy or radiculopathy, lumbar region: Secondary | ICD-10-CM | POA: Diagnosis not present

## 2018-09-27 ENCOUNTER — Encounter: Payer: Self-pay | Admitting: Family Medicine

## 2018-09-27 DIAGNOSIS — Z1231 Encounter for screening mammogram for malignant neoplasm of breast: Secondary | ICD-10-CM | POA: Diagnosis not present

## 2018-09-27 DIAGNOSIS — Z01419 Encounter for gynecological examination (general) (routine) without abnormal findings: Secondary | ICD-10-CM | POA: Diagnosis not present

## 2018-09-27 DIAGNOSIS — Z6822 Body mass index (BMI) 22.0-22.9, adult: Secondary | ICD-10-CM | POA: Diagnosis not present

## 2018-10-16 DIAGNOSIS — M47817 Spondylosis without myelopathy or radiculopathy, lumbosacral region: Secondary | ICD-10-CM | POA: Diagnosis not present

## 2018-10-16 DIAGNOSIS — M47816 Spondylosis without myelopathy or radiculopathy, lumbar region: Secondary | ICD-10-CM | POA: Diagnosis not present

## 2018-10-16 DIAGNOSIS — M9903 Segmental and somatic dysfunction of lumbar region: Secondary | ICD-10-CM | POA: Diagnosis not present

## 2018-10-16 DIAGNOSIS — M9905 Segmental and somatic dysfunction of pelvic region: Secondary | ICD-10-CM | POA: Diagnosis not present

## 2018-12-18 DIAGNOSIS — M47816 Spondylosis without myelopathy or radiculopathy, lumbar region: Secondary | ICD-10-CM | POA: Diagnosis not present

## 2018-12-18 DIAGNOSIS — M47817 Spondylosis without myelopathy or radiculopathy, lumbosacral region: Secondary | ICD-10-CM | POA: Diagnosis not present

## 2018-12-18 DIAGNOSIS — M9905 Segmental and somatic dysfunction of pelvic region: Secondary | ICD-10-CM | POA: Diagnosis not present

## 2018-12-18 DIAGNOSIS — M9903 Segmental and somatic dysfunction of lumbar region: Secondary | ICD-10-CM | POA: Diagnosis not present

## 2019-01-15 DIAGNOSIS — M9903 Segmental and somatic dysfunction of lumbar region: Secondary | ICD-10-CM | POA: Diagnosis not present

## 2019-01-15 DIAGNOSIS — M47817 Spondylosis without myelopathy or radiculopathy, lumbosacral region: Secondary | ICD-10-CM | POA: Diagnosis not present

## 2019-01-15 DIAGNOSIS — M9905 Segmental and somatic dysfunction of pelvic region: Secondary | ICD-10-CM | POA: Diagnosis not present

## 2019-01-15 DIAGNOSIS — M47816 Spondylosis without myelopathy or radiculopathy, lumbar region: Secondary | ICD-10-CM | POA: Diagnosis not present

## 2019-02-11 ENCOUNTER — Encounter: Payer: Self-pay | Admitting: Family Medicine

## 2019-02-11 ENCOUNTER — Ambulatory Visit: Payer: BC Managed Care – PPO | Admitting: Family Medicine

## 2019-02-11 ENCOUNTER — Other Ambulatory Visit: Payer: Self-pay

## 2019-02-11 VITALS — BP 102/60 | HR 78 | Temp 98.5°F | Wt 149.2 lb

## 2019-02-11 DIAGNOSIS — R300 Dysuria: Secondary | ICD-10-CM | POA: Diagnosis not present

## 2019-02-11 DIAGNOSIS — N390 Urinary tract infection, site not specified: Secondary | ICD-10-CM | POA: Diagnosis not present

## 2019-02-11 LAB — POCT URINALYSIS DIPSTICK
Bilirubin, UA: NEGATIVE
Glucose, UA: NEGATIVE
Ketones, UA: NEGATIVE
Nitrite, UA: NEGATIVE
Protein, UA: NEGATIVE
Spec Grav, UA: 1.01 (ref 1.010–1.025)
Urobilinogen, UA: 0.2 E.U./dL
pH, UA: 7.5 (ref 5.0–8.0)

## 2019-02-11 MED ORDER — CIPROFLOXACIN HCL 500 MG PO TABS: 500.0000 mg | ORAL_TABLET | Freq: Two times a day (BID) | ORAL | 0 refills | Status: DC

## 2019-02-11 NOTE — Progress Notes (Signed)
   Subjective:    Patient ID: Patricia Moyer, female    DOB: 04-03-1966, 53 y.o.   MRN: [id number]  HPI Here for 2 days of urinary urgency and burning. No back pain or fever. She is drinking lots of water.   Review of Systems  Constitutional: Negative.   Respiratory: Negative.   Cardiovascular: Negative.   Gastrointestinal: Negative.   Genitourinary: Positive for dysuria and urgency. Negative for flank pain, frequency, hematuria and pelvic pain.       Objective:   Physical Exam Constitutional:      Appearance: Normal appearance.  Cardiovascular:     Rate and Rhythm: Normal rate and regular rhythm.     Pulses: Normal pulses.     Heart sounds: Normal heart sounds.  Pulmonary:     Effort: Pulmonary effort is normal.     Breath sounds: Normal breath sounds.  Abdominal:     General: Abdomen is flat. Bowel sounds are normal. There is no distension.     Palpations: Abdomen is soft. There is no mass.     Tenderness: There is no abdominal tenderness. There is no guarding or rebound.     Hernia: No hernia is present.  Neurological:     Mental Status: She is alert.           Assessment & Plan:  UTI. Treat with Cipro. Culture the sample.  Alysia Penna, MD

## 2019-02-11 NOTE — Progress Notes (Signed)
ua

## 2019-02-13 ENCOUNTER — Encounter: Payer: Self-pay | Admitting: *Deleted

## 2019-02-13 LAB — URINE CULTURE
MICRO NUMBER:: 622194
SPECIMEN QUALITY:: ADEQUATE

## 2019-02-19 DIAGNOSIS — M47817 Spondylosis without myelopathy or radiculopathy, lumbosacral region: Secondary | ICD-10-CM | POA: Diagnosis not present

## 2019-02-19 DIAGNOSIS — M9905 Segmental and somatic dysfunction of pelvic region: Secondary | ICD-10-CM | POA: Diagnosis not present

## 2019-02-19 DIAGNOSIS — M47816 Spondylosis without myelopathy or radiculopathy, lumbar region: Secondary | ICD-10-CM | POA: Diagnosis not present

## 2019-02-19 DIAGNOSIS — M9903 Segmental and somatic dysfunction of lumbar region: Secondary | ICD-10-CM | POA: Diagnosis not present

## 2019-02-27 DIAGNOSIS — U071 COVID-19: Secondary | ICD-10-CM | POA: Diagnosis not present

## 2019-03-02 ENCOUNTER — Telehealth: Payer: Self-pay | Admitting: *Deleted

## 2019-03-02 NOTE — Telephone Encounter (Signed)
Patient called after hours line on 7.18.2020. Patient verbalizes she got news today that she had a positive test late Wednesday afternoon. She wants to get retested because the lab was stating that test could be false positive. Dr. Sarajane Jews please advise

## 2019-03-03 NOTE — Telephone Encounter (Signed)
Her urine culture grew a bacteria that is susceptible to the Cipro she was taking. This is very straightforward. She does NOT need to be retested

## 2019-03-04 NOTE — Telephone Encounter (Signed)
Covid test was positive. She received her results Saturday. Her lab result stated that it was 50% positive. She has been retested yesterday and is waiting for results.nothing further needed.

## 2019-03-19 DIAGNOSIS — M9903 Segmental and somatic dysfunction of lumbar region: Secondary | ICD-10-CM | POA: Diagnosis not present

## 2019-03-19 DIAGNOSIS — M47816 Spondylosis without myelopathy or radiculopathy, lumbar region: Secondary | ICD-10-CM | POA: Diagnosis not present

## 2019-03-19 DIAGNOSIS — M47817 Spondylosis without myelopathy or radiculopathy, lumbosacral region: Secondary | ICD-10-CM | POA: Diagnosis not present

## 2019-03-19 DIAGNOSIS — M9905 Segmental and somatic dysfunction of pelvic region: Secondary | ICD-10-CM | POA: Diagnosis not present

## 2019-04-16 DIAGNOSIS — M9903 Segmental and somatic dysfunction of lumbar region: Secondary | ICD-10-CM | POA: Diagnosis not present

## 2019-04-16 DIAGNOSIS — M47817 Spondylosis without myelopathy or radiculopathy, lumbosacral region: Secondary | ICD-10-CM | POA: Diagnosis not present

## 2019-04-16 DIAGNOSIS — M47816 Spondylosis without myelopathy or radiculopathy, lumbar region: Secondary | ICD-10-CM | POA: Diagnosis not present

## 2019-04-16 DIAGNOSIS — M9905 Segmental and somatic dysfunction of pelvic region: Secondary | ICD-10-CM | POA: Diagnosis not present

## 2019-04-25 DIAGNOSIS — D225 Melanocytic nevi of trunk: Secondary | ICD-10-CM | POA: Diagnosis not present

## 2019-04-25 DIAGNOSIS — D2261 Melanocytic nevi of right upper limb, including shoulder: Secondary | ICD-10-CM | POA: Diagnosis not present

## 2019-04-25 DIAGNOSIS — Z86018 Personal history of other benign neoplasm: Secondary | ICD-10-CM | POA: Diagnosis not present

## 2019-04-25 DIAGNOSIS — Z87898 Personal history of other specified conditions: Secondary | ICD-10-CM | POA: Diagnosis not present

## 2019-05-14 DIAGNOSIS — M47817 Spondylosis without myelopathy or radiculopathy, lumbosacral region: Secondary | ICD-10-CM | POA: Diagnosis not present

## 2019-05-14 DIAGNOSIS — M9903 Segmental and somatic dysfunction of lumbar region: Secondary | ICD-10-CM | POA: Diagnosis not present

## 2019-05-14 DIAGNOSIS — M47816 Spondylosis without myelopathy or radiculopathy, lumbar region: Secondary | ICD-10-CM | POA: Diagnosis not present

## 2019-05-14 DIAGNOSIS — M9905 Segmental and somatic dysfunction of pelvic region: Secondary | ICD-10-CM | POA: Diagnosis not present

## 2019-06-11 DIAGNOSIS — M9905 Segmental and somatic dysfunction of pelvic region: Secondary | ICD-10-CM | POA: Diagnosis not present

## 2019-06-11 DIAGNOSIS — M47816 Spondylosis without myelopathy or radiculopathy, lumbar region: Secondary | ICD-10-CM | POA: Diagnosis not present

## 2019-06-11 DIAGNOSIS — M47817 Spondylosis without myelopathy or radiculopathy, lumbosacral region: Secondary | ICD-10-CM | POA: Diagnosis not present

## 2019-06-11 DIAGNOSIS — M9903 Segmental and somatic dysfunction of lumbar region: Secondary | ICD-10-CM | POA: Diagnosis not present

## 2019-06-27 ENCOUNTER — Other Ambulatory Visit: Payer: Self-pay | Admitting: Obstetrics and Gynecology

## 2019-06-27 DIAGNOSIS — N6310 Unspecified lump in the right breast, unspecified quadrant: Secondary | ICD-10-CM | POA: Diagnosis not present

## 2019-06-27 DIAGNOSIS — N63 Unspecified lump in unspecified breast: Secondary | ICD-10-CM

## 2019-07-03 ENCOUNTER — Ambulatory Visit
Admission: RE | Admit: 2019-07-03 | Discharge: 2019-07-03 | Disposition: A | Discharge status: Home / Self Care | Payer: BC Managed Care – PPO | Source: Ambulatory Visit | Attending: Obstetrics and Gynecology | Admitting: Obstetrics and Gynecology

## 2019-07-03 ENCOUNTER — Other Ambulatory Visit: Payer: Self-pay

## 2019-07-03 DIAGNOSIS — N63 Unspecified lump in unspecified breast: Secondary | ICD-10-CM

## 2019-07-03 DIAGNOSIS — R928 Other abnormal and inconclusive findings on diagnostic imaging of breast: Secondary | ICD-10-CM | POA: Diagnosis not present

## 2019-07-03 DIAGNOSIS — N6489 Other specified disorders of breast: Secondary | ICD-10-CM | POA: Diagnosis not present

## 2019-07-09 DIAGNOSIS — M47816 Spondylosis without myelopathy or radiculopathy, lumbar region: Secondary | ICD-10-CM | POA: Diagnosis not present

## 2019-07-09 DIAGNOSIS — M9905 Segmental and somatic dysfunction of pelvic region: Secondary | ICD-10-CM | POA: Diagnosis not present

## 2019-07-09 DIAGNOSIS — M9903 Segmental and somatic dysfunction of lumbar region: Secondary | ICD-10-CM | POA: Diagnosis not present

## 2019-07-09 DIAGNOSIS — M47817 Spondylosis without myelopathy or radiculopathy, lumbosacral region: Secondary | ICD-10-CM | POA: Diagnosis not present

## 2019-08-05 DIAGNOSIS — M9905 Segmental and somatic dysfunction of pelvic region: Secondary | ICD-10-CM | POA: Diagnosis not present

## 2019-08-05 DIAGNOSIS — M47816 Spondylosis without myelopathy or radiculopathy, lumbar region: Secondary | ICD-10-CM | POA: Diagnosis not present

## 2019-08-05 DIAGNOSIS — M9903 Segmental and somatic dysfunction of lumbar region: Secondary | ICD-10-CM | POA: Diagnosis not present

## 2019-08-05 DIAGNOSIS — M47817 Spondylosis without myelopathy or radiculopathy, lumbosacral region: Secondary | ICD-10-CM | POA: Diagnosis not present

## 2019-08-18 DIAGNOSIS — J3089 Other allergic rhinitis: Secondary | ICD-10-CM | POA: Insufficient documentation

## 2019-08-18 DIAGNOSIS — H60311 Diffuse otitis externa, right ear: Secondary | ICD-10-CM | POA: Diagnosis not present

## 2019-09-01 DIAGNOSIS — M9905 Segmental and somatic dysfunction of pelvic region: Secondary | ICD-10-CM | POA: Diagnosis not present

## 2019-09-01 DIAGNOSIS — M47817 Spondylosis without myelopathy or radiculopathy, lumbosacral region: Secondary | ICD-10-CM | POA: Diagnosis not present

## 2019-09-01 DIAGNOSIS — M47816 Spondylosis without myelopathy or radiculopathy, lumbar region: Secondary | ICD-10-CM | POA: Diagnosis not present

## 2019-09-01 DIAGNOSIS — M9903 Segmental and somatic dysfunction of lumbar region: Secondary | ICD-10-CM | POA: Diagnosis not present

## 2019-09-26 DIAGNOSIS — R635 Abnormal weight gain: Secondary | ICD-10-CM | POA: Diagnosis not present

## 2019-09-29 DIAGNOSIS — M47817 Spondylosis without myelopathy or radiculopathy, lumbosacral region: Secondary | ICD-10-CM | POA: Diagnosis not present

## 2019-09-29 DIAGNOSIS — M9903 Segmental and somatic dysfunction of lumbar region: Secondary | ICD-10-CM | POA: Diagnosis not present

## 2019-09-29 DIAGNOSIS — M9905 Segmental and somatic dysfunction of pelvic region: Secondary | ICD-10-CM | POA: Diagnosis not present

## 2019-09-29 DIAGNOSIS — M47816 Spondylosis without myelopathy or radiculopathy, lumbar region: Secondary | ICD-10-CM | POA: Diagnosis not present

## 2019-09-30 ENCOUNTER — Other Ambulatory Visit: Payer: Self-pay

## 2019-09-30 DIAGNOSIS — E7800 Pure hypercholesterolemia, unspecified: Secondary | ICD-10-CM | POA: Diagnosis not present

## 2019-09-30 DIAGNOSIS — Z6822 Body mass index (BMI) 22.0-22.9, adult: Secondary | ICD-10-CM | POA: Diagnosis not present

## 2019-09-30 DIAGNOSIS — Z1331 Encounter for screening for depression: Secondary | ICD-10-CM | POA: Diagnosis not present

## 2019-09-30 DIAGNOSIS — N951 Menopausal and female climacteric states: Secondary | ICD-10-CM | POA: Diagnosis not present

## 2019-09-30 DIAGNOSIS — R232 Flushing: Secondary | ICD-10-CM | POA: Diagnosis not present

## 2019-09-30 DIAGNOSIS — Z1339 Encounter for screening examination for other mental health and behavioral disorders: Secondary | ICD-10-CM | POA: Diagnosis not present

## 2019-10-02 ENCOUNTER — Ambulatory Visit: Payer: BC Managed Care – PPO | Admitting: Family Medicine

## 2019-10-03 ENCOUNTER — Ambulatory Visit (INDEPENDENT_AMBULATORY_CARE_PROVIDER_SITE_OTHER): Payer: BC Managed Care – PPO | Admitting: Family Medicine

## 2019-10-03 ENCOUNTER — Encounter: Payer: Self-pay | Admitting: Family Medicine

## 2019-10-03 ENCOUNTER — Other Ambulatory Visit: Payer: Self-pay

## 2019-10-03 VITALS — BP 110/64 | HR 106 | Temp 97.9°F | Wt 156.2 lb

## 2019-10-03 DIAGNOSIS — I7300 Raynaud's syndrome without gangrene: Secondary | ICD-10-CM | POA: Diagnosis not present

## 2019-10-03 DIAGNOSIS — E039 Hypothyroidism, unspecified: Secondary | ICD-10-CM | POA: Diagnosis not present

## 2019-10-03 MED ORDER — LEVOTHYROXINE SODIUM 75 MCG PO TABS: 75.0000 ug | ORAL_TABLET | Freq: Every day | ORAL | 3 refills | Status: DC

## 2019-10-03 NOTE — Progress Notes (Signed)
   Subjective:    Patient ID: Patricia Moyer, female    DOB: 1966-01-16, 54 y.o.   MRN: [id number]  HPI Here for several issues. First over the past 3 months she noticed some rather sudden chnages in her body including fatigue and a 10 lb weight gain. This despite the fact that she watches her diet and exercises regularly. She has also noticed some fluid retention, and her feet and ankles swell a bit at the end of the day. Her menses are still unchanged and regular. She recently went to the [facility] clinic and had lab work done. She was told she had a thyroid problem and it was recommended she see her PCP about this. She brings copies of hr labs today, and in deed her TSH was high at 5.125. her free Ts was normal at 2.7, and her free T4 was at the lower end of normal at 0.85. Another issue she wants to discuss is the fact that her fingers and toes will change colors, often white or purple, and they feel cold or numb at times. This happens in the winter months more than any other. She never has pain with this.    Review of Systems  Constitutional: Positive for fatigue and unexpected weight change.  Respiratory: Negative.   Cardiovascular: Positive for leg swelling. Negative for chest pain and palpitations.  Gastrointestinal: Negative.   Endocrine: Negative.   Genitourinary: Negative.   Skin: Positive for color change.  Neurological: Negative.        Objective:   Physical Exam Constitutional:      Appearance: Normal appearance.  Cardiovascular:     Rate and Rhythm: Normal rate and regular rhythm.     Pulses: Normal pulses.     Heart sounds: Normal heart sounds.     Comments: Distal pulses are full. Capillary refill is normal.  Pulmonary:     Effort: Pulmonary effort is normal.     Breath sounds: Normal breath sounds.  Skin:    General: Skin is warm and dry.     Coloration: Skin is not pale.  Neurological:     General: No focal deficit present.     Mental Status: She is alert and  oriented to person, place, and time.           Assessment & Plan:  She is mildly hypothyroid, and this can certainly explain her weight gain, fatigue, and fluid retention. She will start on Synthroid 75 mcg daily, and we will recheck labs in 90 days. She also has mild Raynaud's syndrome. I explained the benign nature of this and we agreed to simply monitor this for now. She will try to keep the hands and feet warm. Alysia Penna, MD

## 2019-10-09 DIAGNOSIS — E7800 Pure hypercholesterolemia, unspecified: Secondary | ICD-10-CM | POA: Diagnosis not present

## 2019-10-09 DIAGNOSIS — Z6822 Body mass index (BMI) 22.0-22.9, adult: Secondary | ICD-10-CM | POA: Diagnosis not present

## 2019-10-16 DIAGNOSIS — Z6822 Body mass index (BMI) 22.0-22.9, adult: Secondary | ICD-10-CM | POA: Diagnosis not present

## 2019-10-16 DIAGNOSIS — E7800 Pure hypercholesterolemia, unspecified: Secondary | ICD-10-CM | POA: Diagnosis not present

## 2019-10-21 ENCOUNTER — Telehealth: Payer: Self-pay | Admitting: Family Medicine

## 2019-10-21 DIAGNOSIS — Z209 Contact with and (suspected) exposure to unspecified communicable disease: Secondary | ICD-10-CM

## 2019-10-21 NOTE — Telephone Encounter (Signed)
Please advise. Ok for [employer] antibody labs?

## 2019-10-21 NOTE — Telephone Encounter (Signed)
Pt call want to have the antibiotic testing and would like a call back.

## 2019-10-21 NOTE — Telephone Encounter (Signed)
I placed the order, she can now set up a lab appt

## 2019-10-22 DIAGNOSIS — Z6822 Body mass index (BMI) 22.0-22.9, adult: Secondary | ICD-10-CM | POA: Diagnosis not present

## 2019-10-22 DIAGNOSIS — E7800 Pure hypercholesterolemia, unspecified: Secondary | ICD-10-CM | POA: Diagnosis not present

## 2019-10-24 ENCOUNTER — Other Ambulatory Visit (INDEPENDENT_AMBULATORY_CARE_PROVIDER_SITE_OTHER): Payer: BC Managed Care – PPO

## 2019-10-24 ENCOUNTER — Telehealth: Payer: Self-pay | Admitting: Family Medicine

## 2019-10-24 ENCOUNTER — Other Ambulatory Visit: Payer: Self-pay

## 2019-10-24 DIAGNOSIS — Z23 Encounter for immunization: Secondary | ICD-10-CM | POA: Diagnosis not present

## 2019-10-24 DIAGNOSIS — Z209 Contact with and (suspected) exposure to unspecified communicable disease: Secondary | ICD-10-CM

## 2019-10-24 LAB — SARS-COV-2 IGG: SARS-COV-2 IgG: 0.05

## 2019-10-24 NOTE — Telephone Encounter (Signed)
Okay to schedule

## 2019-10-24 NOTE — Telephone Encounter (Signed)
Shingles vaccine given

## 2019-10-24 NOTE — Telephone Encounter (Signed)
Pt would like to get the shingle vaccine? Today if possible she comes in for the covid antibody test at 2:40 today.  Informed pt that they may not have time to get to her before 2:40. Pt understood.   Pt can be reached at [phone]

## 2019-10-24 NOTE — Telephone Encounter (Signed)
Yes she can have the Shingrix vaccine

## 2019-10-27 DIAGNOSIS — M47816 Spondylosis without myelopathy or radiculopathy, lumbar region: Secondary | ICD-10-CM | POA: Diagnosis not present

## 2019-10-27 DIAGNOSIS — M47817 Spondylosis without myelopathy or radiculopathy, lumbosacral region: Secondary | ICD-10-CM | POA: Diagnosis not present

## 2019-10-27 DIAGNOSIS — M9903 Segmental and somatic dysfunction of lumbar region: Secondary | ICD-10-CM | POA: Diagnosis not present

## 2019-10-27 DIAGNOSIS — M9905 Segmental and somatic dysfunction of pelvic region: Secondary | ICD-10-CM | POA: Diagnosis not present

## 2019-10-28 DIAGNOSIS — Z1231 Encounter for screening mammogram for malignant neoplasm of breast: Secondary | ICD-10-CM | POA: Diagnosis not present

## 2019-10-28 DIAGNOSIS — Z6822 Body mass index (BMI) 22.0-22.9, adult: Secondary | ICD-10-CM | POA: Diagnosis not present

## 2019-10-28 DIAGNOSIS — Z01419 Encounter for gynecological examination (general) (routine) without abnormal findings: Secondary | ICD-10-CM | POA: Diagnosis not present

## 2019-11-07 ENCOUNTER — Ambulatory Visit: Payer: BC Managed Care – PPO | Attending: Internal Medicine

## 2019-11-07 DIAGNOSIS — Z23 Encounter for immunization: Secondary | ICD-10-CM

## 2019-11-07 NOTE — Progress Notes (Signed)
   Covid-19 Vaccination Clinic  Name:  Patricia Moyer    MRN: [id number] DOB: 1965-09-14  11/07/2019  [name] was observed post Covid-19 immunization for 15 minutes without incident. She was provided with Vaccine Information Sheet and instruction to access the V-Safe system.   [name] was instructed to call 911 with any severe reactions post vaccine: Marland Kitchen Difficulty breathing  . Swelling of face and throat  . A fast heartbeat  . A bad rash all over body  . Dizziness and weakness   Immunizations Administered    Name Date Dose VIS Date Route   Pfizer COVID-19 Vaccine 11/07/2019  9:04 AM 0.3 mL 07/25/2019 Intramuscular   Manufacturer: [place]   Lot: [id number]   [place]: [id number]

## 2019-11-24 DIAGNOSIS — M47816 Spondylosis without myelopathy or radiculopathy, lumbar region: Secondary | ICD-10-CM | POA: Diagnosis not present

## 2019-11-24 DIAGNOSIS — M47817 Spondylosis without myelopathy or radiculopathy, lumbosacral region: Secondary | ICD-10-CM | POA: Diagnosis not present

## 2019-11-24 DIAGNOSIS — M9905 Segmental and somatic dysfunction of pelvic region: Secondary | ICD-10-CM | POA: Diagnosis not present

## 2019-11-24 DIAGNOSIS — M9903 Segmental and somatic dysfunction of lumbar region: Secondary | ICD-10-CM | POA: Diagnosis not present

## 2019-12-01 ENCOUNTER — Ambulatory Visit: Payer: BC Managed Care – PPO | Attending: Internal Medicine

## 2019-12-01 DIAGNOSIS — Z23 Encounter for immunization: Secondary | ICD-10-CM

## 2019-12-01 NOTE — Progress Notes (Signed)
   Covid-19 Vaccination Clinic  Name:  Patricia Moyer    MRN: [id number] DOB: 07/26/1966  12/01/2019  [name] was observed post Covid-19 immunization for 15 minutes without incident. She was provided with Vaccine Information Sheet and instruction to access the V-Safe system.   [name] was instructed to call 911 with any severe reactions post vaccine: Marland Kitchen Difficulty breathing  . Swelling of face and throat  . A fast heartbeat  . A bad rash all over body  . Dizziness and weakness   Immunizations Administered    Name Date Dose VIS Date Route   Pfizer COVID-19 Vaccine 12/01/2019  2:18 PM 0.3 mL 10/08/2018 Intramuscular   Manufacturer: [place]   Lot: [id number]   [place]: [id number]

## 2019-12-04 DIAGNOSIS — Z803 Family history of malignant neoplasm of breast: Secondary | ICD-10-CM | POA: Diagnosis not present

## 2019-12-15 DIAGNOSIS — N951 Menopausal and female climacteric states: Secondary | ICD-10-CM | POA: Diagnosis not present

## 2019-12-15 DIAGNOSIS — N898 Other specified noninflammatory disorders of vagina: Secondary | ICD-10-CM | POA: Diagnosis not present

## 2019-12-15 DIAGNOSIS — G479 Sleep disorder, unspecified: Secondary | ICD-10-CM | POA: Diagnosis not present

## 2019-12-19 DIAGNOSIS — M47817 Spondylosis without myelopathy or radiculopathy, lumbosacral region: Secondary | ICD-10-CM | POA: Diagnosis not present

## 2019-12-19 DIAGNOSIS — M9905 Segmental and somatic dysfunction of pelvic region: Secondary | ICD-10-CM | POA: Diagnosis not present

## 2019-12-19 DIAGNOSIS — M47816 Spondylosis without myelopathy or radiculopathy, lumbar region: Secondary | ICD-10-CM | POA: Diagnosis not present

## 2019-12-19 DIAGNOSIS — M9903 Segmental and somatic dysfunction of lumbar region: Secondary | ICD-10-CM | POA: Diagnosis not present

## 2020-01-05 ENCOUNTER — Other Ambulatory Visit: Payer: Self-pay

## 2020-01-05 ENCOUNTER — Ambulatory Visit (INDEPENDENT_AMBULATORY_CARE_PROVIDER_SITE_OTHER): Payer: BC Managed Care – PPO

## 2020-01-05 DIAGNOSIS — Z23 Encounter for immunization: Secondary | ICD-10-CM

## 2020-01-05 NOTE — Progress Notes (Signed)
Per orders of Dr. Fry, injection of Shingrix vaccine given by Kendra R Johnson. Patient tolerated injection well.  

## 2020-01-14 DIAGNOSIS — G479 Sleep disorder, unspecified: Secondary | ICD-10-CM | POA: Diagnosis not present

## 2020-01-14 DIAGNOSIS — N951 Menopausal and female climacteric states: Secondary | ICD-10-CM | POA: Diagnosis not present

## 2020-01-14 DIAGNOSIS — N898 Other specified noninflammatory disorders of vagina: Secondary | ICD-10-CM | POA: Diagnosis not present

## 2020-01-16 DIAGNOSIS — G479 Sleep disorder, unspecified: Secondary | ICD-10-CM | POA: Diagnosis not present

## 2020-01-16 DIAGNOSIS — N951 Menopausal and female climacteric states: Secondary | ICD-10-CM | POA: Diagnosis not present

## 2020-01-16 DIAGNOSIS — N898 Other specified noninflammatory disorders of vagina: Secondary | ICD-10-CM | POA: Diagnosis not present

## 2020-01-19 DIAGNOSIS — M9905 Segmental and somatic dysfunction of pelvic region: Secondary | ICD-10-CM | POA: Diagnosis not present

## 2020-01-19 DIAGNOSIS — M47817 Spondylosis without myelopathy or radiculopathy, lumbosacral region: Secondary | ICD-10-CM | POA: Diagnosis not present

## 2020-01-19 DIAGNOSIS — M9903 Segmental and somatic dysfunction of lumbar region: Secondary | ICD-10-CM | POA: Diagnosis not present

## 2020-01-19 DIAGNOSIS — M47816 Spondylosis without myelopathy or radiculopathy, lumbar region: Secondary | ICD-10-CM | POA: Diagnosis not present

## 2020-01-22 DIAGNOSIS — Z9189 Other specified personal risk factors, not elsewhere classified: Secondary | ICD-10-CM | POA: Diagnosis not present

## 2020-01-23 ENCOUNTER — Other Ambulatory Visit: Payer: Self-pay | Admitting: Neurology

## 2020-01-23 ENCOUNTER — Other Ambulatory Visit: Payer: Self-pay | Admitting: Obstetrics and Gynecology

## 2020-01-23 DIAGNOSIS — Z9189 Other specified personal risk factors, not elsewhere classified: Secondary | ICD-10-CM

## 2020-01-28 ENCOUNTER — Other Ambulatory Visit: Payer: Self-pay

## 2020-01-29 ENCOUNTER — Ambulatory Visit (INDEPENDENT_AMBULATORY_CARE_PROVIDER_SITE_OTHER): Payer: BC Managed Care – PPO | Admitting: Family Medicine

## 2020-01-29 ENCOUNTER — Encounter: Payer: Self-pay | Admitting: Family Medicine

## 2020-01-29 VITALS — BP 120/60 | HR 79 | Temp 97.5°F | Wt 144.6 lb

## 2020-01-29 DIAGNOSIS — G2581 Restless legs syndrome: Secondary | ICD-10-CM

## 2020-01-29 DIAGNOSIS — Z0000 Encounter for general adult medical examination without abnormal findings: Secondary | ICD-10-CM

## 2020-01-29 DIAGNOSIS — E039 Hypothyroidism, unspecified: Secondary | ICD-10-CM

## 2020-01-29 LAB — LIPID PANEL
Cholesterol: 164 mg/dL (ref 0–200)
HDL: 66.8 mg/dL (ref >39.00)
LDL Cholesterol: 91 mg/dL (ref 0–99)
NonHDL: 97.64
Total CHOL/HDL Ratio: 2
Triglycerides: 32 mg/dL (ref 0.0–149.0)
VLDL: 6.4 mg/dL (ref 0.0–40.0)

## 2020-01-29 LAB — CBC WITH DIFFERENTIAL/PLATELET
Basophils Absolute: 0 10*3/uL (ref 0.0–0.1)
Basophils Relative: 1.4 % (ref 0.0–3.0)
Eosinophils Absolute: 0.2 10*3/uL (ref 0.0–0.7)
Eosinophils Relative: 5.2 % — ABNORMAL HIGH (ref 0.0–5.0)
HCT: 40.3 % (ref 36.0–46.0)
Hemoglobin: 13.3 g/dL (ref 12.0–15.0)
Lymphocytes Relative: 26.6 % (ref 12.0–46.0)
Lymphs Abs: 0.8 10*3/uL (ref 0.7–4.0)
MCHC: 33.1 g/dL (ref 30.0–36.0)
MCV: 88.1 fl (ref 78.0–100.0)
Monocytes Absolute: 0.3 10*3/uL (ref 0.1–1.0)
Monocytes Relative: 10.8 % (ref 3.0–12.0)
Neutro Abs: 1.6 10*3/uL (ref 1.4–7.7)
Neutrophils Relative %: 56 % (ref 43.0–77.0)
Platelets: 210 10*3/uL (ref 150.0–400.0)
RBC: 4.57 Mil/uL (ref 3.87–5.11)
RDW: 14.4 % (ref 11.5–15.5)
WBC: 2.9 10*3/uL — ABNORMAL LOW (ref 4.0–10.5)

## 2020-01-29 LAB — BASIC METABOLIC PANEL
BUN: 14 mg/dL (ref 6–23)
CO2: 30 mEq/L (ref 19–32)
Calcium: 9.3 mg/dL (ref 8.4–10.5)
Chloride: 102 mEq/L (ref 96–112)
Creatinine, Ser: 0.77 mg/dL (ref 0.40–1.20)
GFR: 78.12 mL/min (ref >60.00)
Glucose, Bld: 67 mg/dL — ABNORMAL LOW (ref 70–99)
Potassium: 4.3 mEq/L (ref 3.5–5.1)
Sodium: 137 mEq/L (ref 135–145)

## 2020-01-29 LAB — HEPATIC FUNCTION PANEL
ALT: 15 U/L (ref 0–35)
AST: 18 U/L (ref 0–37)
Albumin: 4.3 g/dL (ref 3.5–5.2)
Alkaline Phosphatase: 34 U/L — ABNORMAL LOW (ref 39–117)
Bilirubin, Direct: 0.1 mg/dL (ref 0.0–0.3)
Total Bilirubin: 0.5 mg/dL (ref 0.2–1.2)
Total Protein: 6.3 g/dL (ref 6.0–8.3)

## 2020-01-29 LAB — T4, FREE: Free T4: 0.99 ng/dL (ref 0.60–1.60)

## 2020-01-29 LAB — TSH: TSH: 2.57 u[IU]/mL (ref 0.35–4.50)

## 2020-01-29 LAB — T3, FREE: T3, Free: 2.9 pg/mL (ref 2.3–4.2)

## 2020-01-29 MED ORDER — ROPINIROLE HCL 1 MG PO TABS: 1.0000 mg | ORAL_TABLET | Freq: Every day | ORAL | 2 refills | Status: DC

## 2020-01-29 NOTE — Progress Notes (Signed)
Subjective:    Patient ID: Patricia Moyer, female    DOB: 07-22-1966, 54 y.o.   MRN: [id number]  HPI Here for a well exam. She feels well in general. Since starting on Synthroid in February she has lost a little weight and she feels better. She does have trouble sleeping however, and she asks for help. She does not want to take "a sleeping pill" but prefers more natural methods. She also thinks she has some restless legs. For years this has bothered her once in awhile, but in the last past few months it has cropped up more frequently. Now it bothers her about every other night. She describes a "vibrating" sensation in the legs and she feels the need to move them constantly. She gets regular mammograms. Since both of her grandmothers had breast cancer her GYN, Dr. Kathryne Eriksson, ordered genetic screening for her. Fortunately this came back negative.    Review of Systems  Constitutional: Negative.   HENT: Negative.   Eyes: Negative.   Respiratory: Negative.   Cardiovascular: Negative.   Gastrointestinal: Negative.   Genitourinary: Negative for decreased urine volume, difficulty urinating, dyspareunia, dysuria, enuresis, flank pain, frequency, hematuria, pelvic pain and urgency.  Musculoskeletal: Negative.   Skin: Negative.   Neurological: Negative.   Psychiatric/Behavioral: Positive for sleep disturbance.       Objective:   Physical Exam Constitutional:      General: She is not in acute distress.    Appearance: She is well-developed.  HENT:     Head: Normocephalic and atraumatic.     Right Ear: External ear normal.     Left Ear: External ear normal.     Nose: Nose normal.     Mouth/Throat:     Pharynx: No oropharyngeal exudate.  Eyes:     General: No scleral icterus.    Conjunctiva/sclera: Conjunctivae normal.     Pupils: Pupils are equal, round, and reactive to light.  Neck:     Thyroid: No thyromegaly.     Vascular: No JVD.  Cardiovascular:     Rate and Rhythm: Normal  rate and regular rhythm.     Heart sounds: Normal heart sounds. No murmur heard.  No friction rub. No gallop.   Pulmonary:     Effort: Pulmonary effort is normal. No respiratory distress.     Breath sounds: Normal breath sounds. No wheezing or rales.  Chest:     Chest wall: No tenderness.  Abdominal:     General: Bowel sounds are normal. There is no distension.     Palpations: Abdomen is soft. There is no mass.     Tenderness: There is no abdominal tenderness. There is no guarding or rebound.  Musculoskeletal:        General: No tenderness. Normal range of motion.     Cervical back: Normal range of motion and neck supple.  Lymphadenopathy:     Cervical: No cervical adenopathy.  Skin:    General: Skin is warm and dry.     Findings: No erythema or rash.  Neurological:     Mental Status: She is alert and oriented to person, place, and time.     Cranial Nerves: No cranial nerve deficit.     Motor: No abnormal muscle tone.     Coordination: Coordination normal.     Deep Tendon Reflexes: Reflexes are normal and symmetric. Reflexes normal.  Psychiatric:        Behavior: Behavior normal.        Thought Content:  Thought content normal.        Judgment: Judgment normal.           Assessment & Plan:  Well exam. We discussed diet and exercise. Get fasting labs, including a thyroid panel. She will try 10-20 mg of melatonin at bedtime for sleep. We will treat the restless legs with Ropinirole 1mg  at bedtime.  Alysia Penna, MD

## 2020-02-18 DIAGNOSIS — M9903 Segmental and somatic dysfunction of lumbar region: Secondary | ICD-10-CM | POA: Diagnosis not present

## 2020-02-18 DIAGNOSIS — M47816 Spondylosis without myelopathy or radiculopathy, lumbar region: Secondary | ICD-10-CM | POA: Diagnosis not present

## 2020-02-18 DIAGNOSIS — M9905 Segmental and somatic dysfunction of pelvic region: Secondary | ICD-10-CM | POA: Diagnosis not present

## 2020-02-18 DIAGNOSIS — M47817 Spondylosis without myelopathy or radiculopathy, lumbosacral region: Secondary | ICD-10-CM | POA: Diagnosis not present

## 2020-03-05 ENCOUNTER — Telehealth: Payer: Self-pay | Admitting: Family Medicine

## 2020-03-05 NOTE — Telephone Encounter (Signed)
Please advise 

## 2020-03-05 NOTE — Telephone Encounter (Signed)
I do not order Covid tests. I suggest she check with her pharmacy or with an urgent care clinic that could do a PCR test that day

## 2020-03-05 NOTE — Telephone Encounter (Signed)
Noted  

## 2020-03-05 NOTE — Telephone Encounter (Signed)
Pt is calling in requesting to have a PCR COVID test order sent to [facility] that is used to show the test results in [place])  Order for the test need to be done on August 18 in the afternoon Trip to [place] August 21.

## 2020-03-05 NOTE — Telephone Encounter (Signed)
Called pt no answer. Advise pt spouse to let pt know as I spoke with spouse for the same question. Spouse verbalized understanding.

## 2020-03-17 DIAGNOSIS — M9903 Segmental and somatic dysfunction of lumbar region: Secondary | ICD-10-CM | POA: Diagnosis not present

## 2020-03-17 DIAGNOSIS — M9905 Segmental and somatic dysfunction of pelvic region: Secondary | ICD-10-CM | POA: Diagnosis not present

## 2020-03-17 DIAGNOSIS — M47816 Spondylosis without myelopathy or radiculopathy, lumbar region: Secondary | ICD-10-CM | POA: Diagnosis not present

## 2020-03-17 DIAGNOSIS — M47817 Spondylosis without myelopathy or radiculopathy, lumbosacral region: Secondary | ICD-10-CM | POA: Diagnosis not present

## 2020-03-26 ENCOUNTER — Telehealth: Payer: Self-pay | Admitting: Family Medicine

## 2020-03-26 ENCOUNTER — Encounter: Payer: Self-pay | Admitting: Family Medicine

## 2020-03-26 ENCOUNTER — Telehealth (INDEPENDENT_AMBULATORY_CARE_PROVIDER_SITE_OTHER): Payer: BC Managed Care – PPO | Admitting: Family Medicine

## 2020-03-26 DIAGNOSIS — K921 Melena: Secondary | ICD-10-CM | POA: Diagnosis not present

## 2020-03-26 DIAGNOSIS — R197 Diarrhea, unspecified: Secondary | ICD-10-CM

## 2020-03-26 DIAGNOSIS — K219 Gastro-esophageal reflux disease without esophagitis: Secondary | ICD-10-CM

## 2020-03-26 MED ORDER — PANTOPRAZOLE SODIUM 40 MG PO TBEC: 40.0000 mg | DELAYED_RELEASE_TABLET | Freq: Two times a day (BID) | ORAL | 2 refills | Status: DC

## 2020-03-26 NOTE — Telephone Encounter (Signed)
° ° ° ° ° ° °  Pt called to say the pharmacist needs a preauthorization in order to fill the medication pantoprazole   Office needs to call the insurance company for preauthorization.  [place] customer service   [facility] - [place], [place] - [address]  [name] [address]  Phone:  [phone] Fax:  [phone]

## 2020-03-26 NOTE — Progress Notes (Signed)
Subjective:    Patient ID: Patricia Moyer, female    DOB: 04-24-1966, 54 y.o.   MRN: [id number]  HPI Virtual Visit via Video Note  I connected with the patient on 03/26/20 at  1:00 PM EDT by a video enabled telemedicine application and verified that I am speaking with the correct person using two identifiers.  Location patient: home Location provider:work or home office Persons participating in the virtual visit: patient, provider  I discussed the limitations of evaluation and management by telemedicine and the availability of in person appointments. The patient expressed understanding and agreed to proceed.   HPI: Here for 2 and 1/2 weeks of lower abdominal cramps, diarrhea, and dark or black stools that she describes as "coffee grounds". She has some nausea but no vomiting. No fevers. Her appetite is normal. No recent travel. She tried Pepto-Bismol a week ago with no improvement. She admits to a long hx of heartburn, and this has been much worse than usual the past few months. She says her job has been very stressful lately.    ROS: See pertinent positives and negatives per HPI.  Past Medical History:  Diagnosis Date  . Allergy   . GERD (gastroesophageal reflux disease)   . Skin cancer 2005   in situ melanoma back, excision  . Tarsal tunnel syndrome of right side    sees Dr. Herbie Drape   . Thyroid disease    hypothyroidism    Past Surgical History:  Procedure Laterality Date  . COLONOSCOPY  04/28/2016   per Dr. Loletha Carrow, clear, repeat in 10 yrs   . ESOPHAGOGASTRODUODENOSCOPY  09-28-09   per Dr. Deatra Ina, reflux only   . SEPTOPLASTY     per Dr. Melissa Montane   . TONSILLECTOMY    . TYMPANOPLASTY    . UPPER GASTROINTESTINAL ENDOSCOPY      Family History  Problem Relation Age of Onset  . Diabetes Maternal Grandmother   . Breast cancer Maternal Grandmother        paternal grandmother  . Breast cancer Paternal Grandmother   . Colon cancer Neg Hx   . Colon polyps Neg Hx   .  Rectal cancer Neg Hx   . Stomach cancer Neg Hx      Current Outpatient Medications:  .  cetirizine (ZYRTEC) 10 MG tablet, Take 10 mg by mouth daily., Disp: , Rfl:  .  levothyroxine (SYNTHROID) 75 MCG tablet, Take 1 tablet (75 mcg total) by mouth daily., Disp: 90 tablet, Rfl: 3 .  pantoprazole (PROTONIX) 40 MG tablet, Take 1 tablet (40 mg total) by mouth 2 (two) times daily before a meal., Disp: 60 tablet, Rfl: 2 .  rOPINIRole (REQUIP) 1 MG tablet, Take 1 tablet (1 mg total) by mouth at bedtime., Disp: 30 tablet, Rfl: 2 .  triamcinolone cream (KENALOG) 0.1 %, Apply 1 application topically 2 (two) times daily., Disp: 45 g, Rfl: 5  Current Facility-Administered Medications:  .  0.9 %  sodium chloride infusion, 500 mL, Intravenous, Continuous, Danis, Kirke Corin, MD  EXAM:  VITALS per patient if applicable:  GENERAL: alert, oriented, appears well and in no acute distress  HEENT: atraumatic, conjunttiva clear, no obvious abnormalities on inspection of external nose and ears  NECK: normal movements of the head and neck  LUNGS: on inspection no signs of respiratory distress, breathing rate appears normal, no obvious gross SOB, gasping or wheezing  CV: no obvious cyanosis  MS: moves all visible extremities without noticeable abnormality  PSYCH/NEURO: pleasant  and cooperative, no obvious depression or anxiety, speech and thought processing grossly intact  ASSESSMENT AND PLAN: She seems to be having some upper GI bleeding, likely from gastritis or duodenitis, possibly an ulcer. She will adjust her diet accordingly. Start on Pantoprazole 40 mg BID before meals. Recheck in 2 weeks.  Alysia Penna, MD  Discussed the following assessment and plan:  No diagnosis found.     I discussed the assessment and treatment plan with the patient. The patient was provided an opportunity to ask questions and all were answered. The patient agreed with the plan and demonstrated an understanding of the  instructions.   The patient was advised to call back or seek an in-person evaluation if the symptoms worsen or if the condition fails to improve as anticipated.     Review of Systems     Objective:   Physical Exam        Assessment & Plan:

## 2020-03-29 NOTE — Telephone Encounter (Signed)
Received the fax to start the PA on cover my meds.

## 2020-03-31 DIAGNOSIS — Z20822 Contact with and (suspected) exposure to covid-19: Secondary | ICD-10-CM | POA: Diagnosis not present

## 2020-04-01 NOTE — Telephone Encounter (Signed)
PA sent to cover my meds. Key: [id number]

## 2020-04-07 NOTE — Telephone Encounter (Signed)
Please ask her how she is feeling. If she feels back to normal, then we may be able to switch to OTC meds. If not, then we will keep fighting

## 2020-04-13 ENCOUNTER — Telehealth (INDEPENDENT_AMBULATORY_CARE_PROVIDER_SITE_OTHER): Payer: BC Managed Care – PPO | Admitting: Family Medicine

## 2020-04-13 ENCOUNTER — Encounter: Payer: Self-pay | Admitting: Family Medicine

## 2020-04-13 DIAGNOSIS — R197 Diarrhea, unspecified: Secondary | ICD-10-CM

## 2020-04-13 NOTE — Progress Notes (Signed)
Subjective:    Patient ID: Patricia Moyer, female    DOB: September 21, 1965, 54 y.o.   MRN: [id number]  HPI Virtual Visit via Video Note  I connected with the patient on 04/13/20 at 11:00 AM EDT by a video enabled telemedicine application and verified that I am speaking with the correct person using two identifiers.  Location patient: home Location provider:work or home office Persons participating in the virtual visit: patient, provider  I discussed the limitations of evaluation and management by telemedicine and the availability of in person appointments. The patient expressed understanding and agreed to proceed.   HPI: Here for continuing cramps and diarrhea that started one month ago. At first she said she was having a lot of heartburn so we started her on Pantoprazole 40 mg BID. This helped the heartburn but the diarrhea has continued unabated. For awhile the stools were dark and we felt there may have been blood in them, but this has improved. Now she sees what appears to be undigested food coming out. She has 2-3 spells of these cramps followed by diarrhea every day. No nausea or vomiting, no fever. Her appetite is normal. She takes Imodium several times a day. She has been going to work as usual. Her colonoscopy in 2017 was clear.    ROS: See pertinent positives and negatives per HPI.  Past Medical History:  Diagnosis Date  . Allergy   . GERD (gastroesophageal reflux disease)   . Skin cancer 2005   in situ melanoma back, excision  . Tarsal tunnel syndrome of right side    sees Dr. Herbie Drape   . Thyroid disease    hypothyroidism    Past Surgical History:  Procedure Laterality Date  . COLONOSCOPY  04/28/2016   per Dr. Loletha Carrow, clear, repeat in 10 yrs   . ESOPHAGOGASTRODUODENOSCOPY  09-28-09   per Dr. Deatra Ina, reflux only   . SEPTOPLASTY     per Dr. Melissa Montane   . TONSILLECTOMY    . TYMPANOPLASTY    . UPPER GASTROINTESTINAL ENDOSCOPY      Family History  Problem Relation  Age of Onset  . Diabetes Maternal Grandmother   . Breast cancer Maternal Grandmother        paternal grandmother  . Breast cancer Paternal Grandmother   . Colon cancer Neg Hx   . Colon polyps Neg Hx   . Rectal cancer Neg Hx   . Stomach cancer Neg Hx      Current Outpatient Medications:  .  cetirizine (ZYRTEC) 10 MG tablet, Take 10 mg by mouth daily., Disp: , Rfl:  .  levothyroxine (SYNTHROID) 75 MCG tablet, Take 1 tablet (75 mcg total) by mouth daily., Disp: 90 tablet, Rfl: 3 .  pantoprazole (PROTONIX) 40 MG tablet, Take 1 tablet (40 mg total) by mouth 2 (two) times daily before a meal., Disp: 60 tablet, Rfl: 2 .  rOPINIRole (REQUIP) 1 MG tablet, Take 1 tablet (1 mg total) by mouth at bedtime., Disp: 30 tablet, Rfl: 2 .  triamcinolone cream (KENALOG) 0.1 %, Apply 1 application topically 2 (two) times daily., Disp: 45 g, Rfl: 5  Current Facility-Administered Medications:  .  0.9 %  sodium chloride infusion, 500 mL, Intravenous, Continuous, Danis, Kirke Corin, MD  EXAM:  VITALS per patient if applicable:  GENERAL: alert, oriented, appears well and in no acute distress  HEENT: atraumatic, conjunttiva clear, no obvious abnormalities on inspection of external nose and ears  NECK: normal movements of the head and  neck  LUNGS: on inspection no signs of respiratory distress, breathing rate appears normal, no obvious gross SOB, gasping or wheezing  CV: no obvious cyanosis  MS: moves all visible extremities without noticeable abnormality  PSYCH/NEURO: pleasant and cooperative, no obvious depression or anxiety, speech and thought processing grossly intact  ASSESSMENT AND PLAN: Four weeks of diarrhea with cramps. We will have her see the GI office asap. I asked her to decrease the Pantoprazole to 20 mg BID until then. Alysia Penna, MD  Discussed the following assessment and plan:  No diagnosis found.     I discussed the assessment and treatment plan with the patient. The patient  was provided an opportunity to ask questions and all were answered. The patient agreed with the plan and demonstrated an understanding of the instructions.   The patient was advised to call back or seek an in-person evaluation if the symptoms worsen or if the condition fails to improve as anticipated.     Review of Systems     Objective:   Physical Exam        Assessment & Plan:

## 2020-04-14 DIAGNOSIS — M9903 Segmental and somatic dysfunction of lumbar region: Secondary | ICD-10-CM | POA: Diagnosis not present

## 2020-04-14 DIAGNOSIS — M47816 Spondylosis without myelopathy or radiculopathy, lumbar region: Secondary | ICD-10-CM | POA: Diagnosis not present

## 2020-04-14 DIAGNOSIS — M9905 Segmental and somatic dysfunction of pelvic region: Secondary | ICD-10-CM | POA: Diagnosis not present

## 2020-04-14 DIAGNOSIS — M47817 Spondylosis without myelopathy or radiculopathy, lumbosacral region: Secondary | ICD-10-CM | POA: Diagnosis not present

## 2020-04-15 NOTE — Addendum Note (Signed)
Addended by: Alysia Penna A on: 04/15/2020 08:41 AM   Modules accepted: Orders

## 2020-04-15 NOTE — Telephone Encounter (Signed)
I am trying to see if [facility] can see her sooner

## 2020-04-18 ENCOUNTER — Telehealth: Payer: Self-pay | Admitting: Family Medicine

## 2020-04-18 MED ORDER — METRONIDAZOLE 500 MG PO TABS: 500.0000 mg | ORAL_TABLET | Freq: Three times a day (TID) | ORAL | 0 refills | Status: DC

## 2020-04-18 MED ORDER — CIPROFLOXACIN HCL 500 MG PO TABS
500.0000 mg | ORAL_TABLET | Freq: Two times a day (BID) | ORAL | 0 refills | Status: DC
Start: 2020-04-18 — End: 2020-09-16

## 2020-04-18 NOTE — Telephone Encounter (Signed)
She is still having diarrhea, so we will try Cipro and Flagyl for 10 days

## 2020-04-21 ENCOUNTER — Other Ambulatory Visit: Payer: Self-pay | Admitting: Family Medicine

## 2020-04-27 DIAGNOSIS — D225 Melanocytic nevi of trunk: Secondary | ICD-10-CM | POA: Diagnosis not present

## 2020-04-27 DIAGNOSIS — L821 Other seborrheic keratosis: Secondary | ICD-10-CM | POA: Diagnosis not present

## 2020-04-27 DIAGNOSIS — L719 Rosacea, unspecified: Secondary | ICD-10-CM | POA: Diagnosis not present

## 2020-04-27 DIAGNOSIS — L578 Other skin changes due to chronic exposure to nonionizing radiation: Secondary | ICD-10-CM | POA: Diagnosis not present

## 2020-05-04 DIAGNOSIS — R197 Diarrhea, unspecified: Secondary | ICD-10-CM | POA: Diagnosis not present

## 2020-05-05 DIAGNOSIS — R197 Diarrhea, unspecified: Secondary | ICD-10-CM | POA: Diagnosis not present

## 2020-05-11 ENCOUNTER — Ambulatory Visit: Payer: BC Managed Care – PPO | Admitting: Physician Assistant

## 2020-05-18 DIAGNOSIS — M47817 Spondylosis without myelopathy or radiculopathy, lumbosacral region: Secondary | ICD-10-CM | POA: Diagnosis not present

## 2020-05-18 DIAGNOSIS — M47816 Spondylosis without myelopathy or radiculopathy, lumbar region: Secondary | ICD-10-CM | POA: Diagnosis not present

## 2020-05-18 DIAGNOSIS — M9903 Segmental and somatic dysfunction of lumbar region: Secondary | ICD-10-CM | POA: Diagnosis not present

## 2020-05-18 DIAGNOSIS — M9905 Segmental and somatic dysfunction of pelvic region: Secondary | ICD-10-CM | POA: Diagnosis not present

## 2020-05-21 DIAGNOSIS — R197 Diarrhea, unspecified: Secondary | ICD-10-CM | POA: Diagnosis not present

## 2020-05-21 DIAGNOSIS — K219 Gastro-esophageal reflux disease without esophagitis: Secondary | ICD-10-CM | POA: Diagnosis not present

## 2020-05-21 DIAGNOSIS — K52832 Lymphocytic colitis: Secondary | ICD-10-CM | POA: Diagnosis not present

## 2020-05-21 DIAGNOSIS — K5289 Other specified noninfective gastroenteritis and colitis: Secondary | ICD-10-CM | POA: Diagnosis not present

## 2020-05-21 DIAGNOSIS — R109 Unspecified abdominal pain: Secondary | ICD-10-CM | POA: Diagnosis not present

## 2020-05-21 HISTORY — PX: COLONOSCOPY: SHX174

## 2020-05-21 HISTORY — PX: ESOPHAGOGASTRODUODENOSCOPY: SHX1529

## 2020-05-26 ENCOUNTER — Encounter: Payer: Self-pay | Admitting: Family Medicine

## 2020-05-27 NOTE — Telephone Encounter (Signed)
noted 

## 2020-06-15 DIAGNOSIS — M47816 Spondylosis without myelopathy or radiculopathy, lumbar region: Secondary | ICD-10-CM | POA: Diagnosis not present

## 2020-06-15 DIAGNOSIS — M9905 Segmental and somatic dysfunction of pelvic region: Secondary | ICD-10-CM | POA: Diagnosis not present

## 2020-06-15 DIAGNOSIS — M9903 Segmental and somatic dysfunction of lumbar region: Secondary | ICD-10-CM | POA: Diagnosis not present

## 2020-06-15 DIAGNOSIS — M47817 Spondylosis without myelopathy or radiculopathy, lumbosacral region: Secondary | ICD-10-CM | POA: Diagnosis not present

## 2020-06-18 ENCOUNTER — Ambulatory Visit
Admission: RE | Admit: 2020-06-18 | Discharge: 2020-06-18 | Disposition: A | Discharge status: Home / Self Care | Payer: BC Managed Care – PPO | Source: Ambulatory Visit | Attending: Obstetrics and Gynecology | Admitting: Obstetrics and Gynecology

## 2020-06-18 DIAGNOSIS — Z9189 Other specified personal risk factors, not elsewhere classified: Secondary | ICD-10-CM

## 2020-06-18 DIAGNOSIS — N6489 Other specified disorders of breast: Secondary | ICD-10-CM | POA: Diagnosis not present

## 2020-06-18 MED ORDER — GADOBUTROL 1 MMOL/ML IV SOLN
6.0000 mL | Freq: Once | INTRAVENOUS | Status: AC | PRN
  Administered 2020-06-18: 6 mL via INTRAVENOUS

## 2020-07-06 DIAGNOSIS — R197 Diarrhea, unspecified: Secondary | ICD-10-CM | POA: Diagnosis not present

## 2020-07-06 DIAGNOSIS — K52832 Lymphocytic colitis: Secondary | ICD-10-CM | POA: Diagnosis not present

## 2020-07-15 DIAGNOSIS — M9903 Segmental and somatic dysfunction of lumbar region: Secondary | ICD-10-CM | POA: Diagnosis not present

## 2020-07-15 DIAGNOSIS — D259 Leiomyoma of uterus, unspecified: Secondary | ICD-10-CM | POA: Diagnosis not present

## 2020-07-15 DIAGNOSIS — N858 Other specified noninflammatory disorders of uterus: Secondary | ICD-10-CM | POA: Diagnosis not present

## 2020-07-15 DIAGNOSIS — M47816 Spondylosis without myelopathy or radiculopathy, lumbar region: Secondary | ICD-10-CM | POA: Diagnosis not present

## 2020-07-15 DIAGNOSIS — M9905 Segmental and somatic dysfunction of pelvic region: Secondary | ICD-10-CM | POA: Diagnosis not present

## 2020-07-15 DIAGNOSIS — M47817 Spondylosis without myelopathy or radiculopathy, lumbosacral region: Secondary | ICD-10-CM | POA: Diagnosis not present

## 2020-07-20 DIAGNOSIS — D259 Leiomyoma of uterus, unspecified: Secondary | ICD-10-CM | POA: Diagnosis not present

## 2020-07-26 ENCOUNTER — Other Ambulatory Visit: Payer: Self-pay | Admitting: Family Medicine

## 2020-07-29 DIAGNOSIS — D259 Leiomyoma of uterus, unspecified: Secondary | ICD-10-CM | POA: Diagnosis not present

## 2020-08-16 ENCOUNTER — Telehealth: Payer: Self-pay

## 2020-08-16 NOTE — Telephone Encounter (Signed)
Pt. Calling to schedule COVID 19 test for today. No availability until Wednesday. Will call [facility].

## 2020-08-19 DIAGNOSIS — M9903 Segmental and somatic dysfunction of lumbar region: Secondary | ICD-10-CM | POA: Diagnosis not present

## 2020-08-19 DIAGNOSIS — M9905 Segmental and somatic dysfunction of pelvic region: Secondary | ICD-10-CM | POA: Diagnosis not present

## 2020-08-19 DIAGNOSIS — M47816 Spondylosis without myelopathy or radiculopathy, lumbar region: Secondary | ICD-10-CM | POA: Diagnosis not present

## 2020-08-19 DIAGNOSIS — M47817 Spondylosis without myelopathy or radiculopathy, lumbosacral region: Secondary | ICD-10-CM | POA: Diagnosis not present

## 2020-08-20 ENCOUNTER — Ambulatory Visit
Admission: RE | Admit: 2020-08-20 | Discharge: 2020-08-20 | Disposition: A | Discharge status: Home / Self Care | Payer: BC Managed Care – PPO | Source: Ambulatory Visit | Attending: Chiropractic Medicine | Admitting: Chiropractic Medicine

## 2020-08-20 ENCOUNTER — Other Ambulatory Visit: Payer: Self-pay | Admitting: Chiropractic Medicine

## 2020-08-20 DIAGNOSIS — R52 Pain, unspecified: Secondary | ICD-10-CM

## 2020-08-20 DIAGNOSIS — R079 Chest pain, unspecified: Secondary | ICD-10-CM | POA: Diagnosis not present

## 2020-08-20 DIAGNOSIS — Q676 Pectus excavatum: Secondary | ICD-10-CM | POA: Diagnosis not present

## 2020-08-20 DIAGNOSIS — R0781 Pleurodynia: Secondary | ICD-10-CM | POA: Diagnosis not present

## 2020-09-16 ENCOUNTER — Ambulatory Visit (INDEPENDENT_AMBULATORY_CARE_PROVIDER_SITE_OTHER): Payer: BC Managed Care – PPO | Admitting: Family Medicine

## 2020-09-16 ENCOUNTER — Encounter: Payer: Self-pay | Admitting: Family Medicine

## 2020-09-16 ENCOUNTER — Other Ambulatory Visit: Payer: Self-pay

## 2020-09-16 VITALS — BP 110/80 | HR 68 | Ht 68.5 in | Wt 159.0 lb

## 2020-09-16 DIAGNOSIS — K529 Noninfective gastroenteritis and colitis, unspecified: Secondary | ICD-10-CM | POA: Diagnosis not present

## 2020-09-16 DIAGNOSIS — R600 Localized edema: Secondary | ICD-10-CM | POA: Diagnosis not present

## 2020-09-16 DIAGNOSIS — E039 Hypothyroidism, unspecified: Secondary | ICD-10-CM

## 2020-09-16 LAB — BASIC METABOLIC PANEL
BUN: 17 mg/dL (ref 6–23)
CO2: 31 mEq/L (ref 19–32)
Calcium: 9.3 mg/dL (ref 8.4–10.5)
Chloride: 103 mEq/L (ref 96–112)
Creatinine, Ser: 0.73 mg/dL (ref 0.40–1.20)
GFR: 93.11 mL/min (ref >60.00)
Glucose, Bld: 76 mg/dL (ref 70–99)
Potassium: 4.1 mEq/L (ref 3.5–5.1)
Sodium: 138 mEq/L (ref 135–145)

## 2020-09-16 LAB — T3, FREE: T3, Free: 2.3 pg/mL (ref 2.3–4.2)

## 2020-09-16 LAB — TSH: TSH: 1.37 u[IU]/mL (ref 0.35–4.50)

## 2020-09-16 LAB — T4, FREE: Free T4: 0.95 ng/dL (ref 0.60–1.60)

## 2020-09-16 MED ORDER — FUROSEMIDE 20 MG PO TABS: 20.0000 mg | ORAL_TABLET | Freq: Every day | ORAL | 2 refills | Status: DC | PRN

## 2020-09-16 NOTE — Progress Notes (Signed)
   Subjective:    Patient ID: Patricia Moyer, female    DOB: November 11, 1965, 55 y.o.   MRN: [id number]  HPI Her with concerns about swelling in both legs and feet. This started about a month ago but has become more noticeable. Her toes are turning gray. There is no pain. No SOB. She has gained about 15 lbs since last June despite watching diet and working out on her treadmill. She has been seeing Dr. Jamey Reas at [facility] for chronic diarrhea. She had a colonoscopy last fall that demonstrated lymphocytic colitis, and she was started on Budesonide 9 mg a day. She took this daily for several months, and the diarrhea improved. She was slowly tapered down until she took the last dose on 08-07-20. She had made dietary adjustment to avoid dairy products, legumes, etc. Unfortunately the diarrhea came back until she was averaging 5-6 watery stools a day again. No fever or pain. No blood in the stools. All along she had been taking OTC magnesium at night to help her sleep, and she realized this can cause diarrhea. She stopped taking this about 2 weeks ago, and the diarrhea has almost completely stopped. She now has normal well formed stools again.    Review of Systems  Constitutional: Negative.   Respiratory: Negative.   Cardiovascular: Positive for leg swelling. Negative for chest pain and palpitations.  Gastrointestinal: Negative.        Objective:   Physical Exam Constitutional:      Appearance: Normal appearance.  Cardiovascular:     Rate and Rhythm: Normal rate and regular rhythm.     Pulses: Normal pulses.     Heart sounds: Normal heart sounds.  Pulmonary:     Effort: Pulmonary effort is normal.     Breath sounds: Normal breath sounds.  Musculoskeletal:     Comments: 2+ edema in both lower legs, the right more so than the left. The legs and feet are not tender. No cords. The toes are a bit dusky but warm. DP and PT pulses are full.   Neurological:     Mental Status: She is alert.            Assessment & Plan:  Her leg edema and weight gain are from fluid retention, which is a side effect from taking steroids for several months. I reassured her this is temporary. She will stay off steroids. Take Lasix 20 mg daily. Check a thyroid panel and a BMET today. Her diarrhea has resolved now that she is not taking magnesium. Recheck in one month. Alysia Penna, MD

## 2020-09-18 ENCOUNTER — Other Ambulatory Visit: Payer: Self-pay | Admitting: Family Medicine

## 2020-09-23 DIAGNOSIS — M47816 Spondylosis without myelopathy or radiculopathy, lumbar region: Secondary | ICD-10-CM | POA: Diagnosis not present

## 2020-09-23 DIAGNOSIS — M47817 Spondylosis without myelopathy or radiculopathy, lumbosacral region: Secondary | ICD-10-CM | POA: Diagnosis not present

## 2020-09-23 DIAGNOSIS — M9905 Segmental and somatic dysfunction of pelvic region: Secondary | ICD-10-CM | POA: Diagnosis not present

## 2020-09-23 DIAGNOSIS — M9901 Segmental and somatic dysfunction of cervical region: Secondary | ICD-10-CM | POA: Diagnosis not present

## 2020-10-01 DIAGNOSIS — M9901 Segmental and somatic dysfunction of cervical region: Secondary | ICD-10-CM | POA: Diagnosis not present

## 2020-10-01 DIAGNOSIS — M9905 Segmental and somatic dysfunction of pelvic region: Secondary | ICD-10-CM | POA: Diagnosis not present

## 2020-10-01 DIAGNOSIS — M47817 Spondylosis without myelopathy or radiculopathy, lumbosacral region: Secondary | ICD-10-CM | POA: Diagnosis not present

## 2020-10-01 DIAGNOSIS — M47816 Spondylosis without myelopathy or radiculopathy, lumbar region: Secondary | ICD-10-CM | POA: Diagnosis not present

## 2020-10-08 DIAGNOSIS — M9905 Segmental and somatic dysfunction of pelvic region: Secondary | ICD-10-CM | POA: Diagnosis not present

## 2020-10-08 DIAGNOSIS — M47817 Spondylosis without myelopathy or radiculopathy, lumbosacral region: Secondary | ICD-10-CM | POA: Diagnosis not present

## 2020-10-08 DIAGNOSIS — M47816 Spondylosis without myelopathy or radiculopathy, lumbar region: Secondary | ICD-10-CM | POA: Diagnosis not present

## 2020-10-08 DIAGNOSIS — M9901 Segmental and somatic dysfunction of cervical region: Secondary | ICD-10-CM | POA: Diagnosis not present

## 2020-10-15 DIAGNOSIS — M9905 Segmental and somatic dysfunction of pelvic region: Secondary | ICD-10-CM | POA: Diagnosis not present

## 2020-10-15 DIAGNOSIS — M47817 Spondylosis without myelopathy or radiculopathy, lumbosacral region: Secondary | ICD-10-CM | POA: Diagnosis not present

## 2020-10-15 DIAGNOSIS — M47816 Spondylosis without myelopathy or radiculopathy, lumbar region: Secondary | ICD-10-CM | POA: Diagnosis not present

## 2020-10-15 DIAGNOSIS — M9901 Segmental and somatic dysfunction of cervical region: Secondary | ICD-10-CM | POA: Diagnosis not present

## 2020-10-29 DIAGNOSIS — M47816 Spondylosis without myelopathy or radiculopathy, lumbar region: Secondary | ICD-10-CM | POA: Diagnosis not present

## 2020-10-29 DIAGNOSIS — M9905 Segmental and somatic dysfunction of pelvic region: Secondary | ICD-10-CM | POA: Diagnosis not present

## 2020-10-29 DIAGNOSIS — M47817 Spondylosis without myelopathy or radiculopathy, lumbosacral region: Secondary | ICD-10-CM | POA: Diagnosis not present

## 2020-10-29 DIAGNOSIS — M9901 Segmental and somatic dysfunction of cervical region: Secondary | ICD-10-CM | POA: Diagnosis not present

## 2020-11-12 DIAGNOSIS — M9905 Segmental and somatic dysfunction of pelvic region: Secondary | ICD-10-CM | POA: Diagnosis not present

## 2020-11-12 DIAGNOSIS — M9901 Segmental and somatic dysfunction of cervical region: Secondary | ICD-10-CM | POA: Diagnosis not present

## 2020-11-12 DIAGNOSIS — M47816 Spondylosis without myelopathy or radiculopathy, lumbar region: Secondary | ICD-10-CM | POA: Diagnosis not present

## 2020-11-12 DIAGNOSIS — M47817 Spondylosis without myelopathy or radiculopathy, lumbosacral region: Secondary | ICD-10-CM | POA: Diagnosis not present

## 2020-11-15 ENCOUNTER — Encounter: Payer: Self-pay | Admitting: Family Medicine

## 2020-11-15 NOTE — Telephone Encounter (Signed)
First make her an in person OV to follow up on this. We still haven't determined why she has this fluid. I am thinking about increasing her thyroid medication but I want to talk to her about it first

## 2020-11-18 ENCOUNTER — Other Ambulatory Visit: Payer: Self-pay

## 2020-11-18 ENCOUNTER — Ambulatory Visit (INDEPENDENT_AMBULATORY_CARE_PROVIDER_SITE_OTHER): Payer: BC Managed Care – PPO | Admitting: Family Medicine

## 2020-11-18 ENCOUNTER — Encounter: Payer: Self-pay | Admitting: Family Medicine

## 2020-11-18 VITALS — BP 118/82 | HR 68 | Temp 98.2°F | Wt 154.0 lb

## 2020-11-18 DIAGNOSIS — R609 Edema, unspecified: Secondary | ICD-10-CM

## 2020-11-18 MED ORDER — FUROSEMIDE 40 MG PO TABS: 40.0000 mg | ORAL_TABLET | Freq: Every day | ORAL | 5 refills | Status: DC

## 2020-11-18 NOTE — Progress Notes (Signed)
   Subjective:    Patient ID: Patricia Moyer, female    DOB: 07/25/1966, 55 y.o.   MRN: [id number]  HPI Here for fluid retention throughout her body. We saw her 2 months ago for a 15 lb weight gain and we checked her thyroid levels and renal function, which were all normal. We started he on Lasix 20 mg daily. This has not made much of a difference. Her weight has stabilized but she feels bloated and puffy all over her body, even in the face. Her legs and feet remain swollen, and they are mildly painful. No SOB. She sometimes wears compression stockings on the legs, but this does not seem to help. She has become quite concerned that "something is wrong" with her body. She exercises regularly and avoids salt. She is likely premenopausal because she skipped her menses last month and she has some mild hot flashes. This all started last winter after she took steroids for several months to treat a lymphocytic colitis.    Review of Systems  Constitutional: Negative.   Respiratory: Negative.   Cardiovascular: Positive for leg swelling. Negative for chest pain and palpitations.  Gastrointestinal: Negative.   Genitourinary: Negative.   Musculoskeletal: Negative for arthralgias.  Neurological: Negative.        Objective:   Physical Exam Constitutional:      Appearance: Normal appearance. She is not ill-appearing.  Cardiovascular:     Rate and Rhythm: Normal rate and regular rhythm.     Pulses: Normal pulses.     Heart sounds: Normal heart sounds.  Pulmonary:     Effort: Pulmonary effort is normal.     Breath sounds: Normal breath sounds.  Musculoskeletal:     Comments: 2+ edema in both lower legs. She is mildly tender in both lower legs   Skin:    Findings: No erythema or rash.  Neurological:     Mental Status: She is alert.           Assessment & Plan:  She has fluid retention and the etiology of this is unclear. We will increase the Lasix to 40 mg daily. Refer to Rheumatology to  evaluate for autoimmune conditions, etc.  Alysia Penna, MD

## 2020-11-18 NOTE — Telephone Encounter (Signed)
Spoke with the pt and informed her of the message below.  Appt scheduled for today to arrive at 10:15am.

## 2020-11-24 DIAGNOSIS — M47816 Spondylosis without myelopathy or radiculopathy, lumbar region: Secondary | ICD-10-CM | POA: Diagnosis not present

## 2020-11-24 DIAGNOSIS — M47817 Spondylosis without myelopathy or radiculopathy, lumbosacral region: Secondary | ICD-10-CM | POA: Diagnosis not present

## 2020-11-24 DIAGNOSIS — M9905 Segmental and somatic dysfunction of pelvic region: Secondary | ICD-10-CM | POA: Diagnosis not present

## 2020-11-24 DIAGNOSIS — M9901 Segmental and somatic dysfunction of cervical region: Secondary | ICD-10-CM | POA: Diagnosis not present

## 2020-12-21 DIAGNOSIS — M2550 Pain in unspecified joint: Secondary | ICD-10-CM | POA: Diagnosis not present

## 2020-12-21 DIAGNOSIS — D8989 Other specified disorders involving the immune mechanism, not elsewhere classified: Secondary | ICD-10-CM | POA: Diagnosis not present

## 2020-12-21 DIAGNOSIS — R600 Localized edema: Secondary | ICD-10-CM | POA: Diagnosis not present

## 2020-12-28 ENCOUNTER — Encounter: Payer: Self-pay | Admitting: Family Medicine

## 2020-12-28 DIAGNOSIS — R601 Generalized edema: Secondary | ICD-10-CM

## 2020-12-28 NOTE — Telephone Encounter (Signed)
I referred her to Cardiology

## 2020-12-29 DIAGNOSIS — Z1231 Encounter for screening mammogram for malignant neoplasm of breast: Secondary | ICD-10-CM | POA: Diagnosis not present

## 2020-12-29 DIAGNOSIS — Z01419 Encounter for gynecological examination (general) (routine) without abnormal findings: Secondary | ICD-10-CM | POA: Diagnosis not present

## 2020-12-29 DIAGNOSIS — Z6822 Body mass index (BMI) 22.0-22.9, adult: Secondary | ICD-10-CM | POA: Diagnosis not present

## 2020-12-30 ENCOUNTER — Other Ambulatory Visit: Payer: Self-pay | Admitting: Obstetrics and Gynecology

## 2020-12-30 DIAGNOSIS — R928 Other abnormal and inconclusive findings on diagnostic imaging of breast: Secondary | ICD-10-CM

## 2020-12-31 ENCOUNTER — Other Ambulatory Visit: Payer: Self-pay | Admitting: Obstetrics and Gynecology

## 2020-12-31 DIAGNOSIS — Z9189 Other specified personal risk factors, not elsewhere classified: Secondary | ICD-10-CM

## 2021-01-06 ENCOUNTER — Other Ambulatory Visit: Payer: Self-pay

## 2021-01-06 ENCOUNTER — Ambulatory Visit (INDEPENDENT_AMBULATORY_CARE_PROVIDER_SITE_OTHER): Payer: BC Managed Care – PPO | Admitting: Cardiovascular Disease

## 2021-01-06 ENCOUNTER — Encounter: Payer: Self-pay | Admitting: Cardiovascular Disease

## 2021-01-06 VITALS — BP 110/82 | HR 57 | Ht 69.0 in | Wt 151.6 lb

## 2021-01-06 DIAGNOSIS — R600 Localized edema: Secondary | ICD-10-CM | POA: Diagnosis not present

## 2021-01-06 DIAGNOSIS — I7300 Raynaud's syndrome without gangrene: Secondary | ICD-10-CM

## 2021-01-06 DIAGNOSIS — L539 Erythematous condition, unspecified: Secondary | ICD-10-CM

## 2021-01-06 NOTE — Patient Instructions (Signed)
Medication Instructions:  The current medical regimen is effective;  continue present plan and medications as directed. Please refer to the Current Medication list given to you today. *If you need a refill on your cardiac medications before your next appointment, please call your pharmacy*  Lab Work: NONE  Testing/Procedures: Echocardiogram - Your physician has requested that you have an echocardiogram. Echocardiography is a painless test that uses sound waves to create images of your heart. It provides your doctor with information about the size and shape of your heart and how well your heart's chambers and valves are working. This procedure takes approximately one hour. There are no restrictions for this procedure. This will be performed at our [facility] location - [address].  Your physician has requested that you have a lower extremity venous duplex. This test is an ultrasound of the veins in the legs or arms. It looks at venous blood flow that carries blood from the heart to the legs or arms. Allow one hour for a Lower Venous exam. Allow thirty minutes for an Upper Venous exam. There are no restrictions or special instructions.  Follow-Up: Your next appointment:  2 month(s) In Person with Shelva Majestic, MD    At [facility], you and your health needs are our priority.  As part of our continuing mission to provide you with exceptional heart care, we have created designated Provider Care Teams.  These Care Teams include your primary Cardiologist (physician) and Advanced Practice Providers (APPs -  Physician Assistants and Nurse Practitioners) who all work together to provide you with the care you need, when you need it.

## 2021-01-06 NOTE — Progress Notes (Signed)
Cardiology Office Note    Date:  01/13/2021   ID:  Patricia Moyer, DOB July 24, 1966, MRN [id number]  PCP:  Laurey Morale, MD  Cardiologist:  Shelva Majestic, MD   New cardiology evaluation referred through the courtesy of Dr. Alysia Penna for leg swelling.  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 Alysia Penna.  She has experienced progressive lower extremity edema and is referred for Cardiologic assessment.  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  She states that she routinely exercises and works out at least 30 minutes a day either doing yoga, Pilates, or aerobic or resistance exercise.  She developed an episode of lymphocytic colitis last July and subsequently had colonoscopy in October.  She was on steroid therapy for 3 months and finished steroids around Christmas time.  Subsequently, she began to develop swelling in her legs right greater than left since the third week in January.  She was initially started on Lasix 20 mg but due to progressive swelling not really significantly improved her dose was further titrated to 40 mg.  She subsequently underwent a rheumatologic evaluation with Dr. Amil Amen and apparently had comprehensive laboratory drawn which reportedly were within normal limits.  She continues to experience some swelling despite being on furosemide 40 mg and the swelling is worse in the evening.  She also has noticed dependent rubor as well as some mild weight gain with her swelling.  She admits to a history of Raynaud's phenomenon involving her fingers.  She denies any exertionally precipitated chest pain.  She denies any significant change in exercise tolerance or dyspnea.  Due to her continued swelling she presents for Cardiologic evaluation.   Past Medical History:  Diagnosis Date  . Allergy   . Chronic diarrhea    sees Dr. Jamey Reas at [facility]  . GERD  (gastroesophageal reflux disease)   . Skin cancer 2005   in situ melanoma back, excision  . Tarsal tunnel syndrome of right side    sees Dr. Herbie Drape   . Thyroid disease    hypothyroidism    Past Surgical History:  Procedure Laterality Date  . COLONOSCOPY  04/28/2016   per Dr. Loletha Carrow, clear, repeat in 10 yrs   . ESOPHAGOGASTRODUODENOSCOPY  09-28-09   per Dr. Deatra Ina, reflux only   . SEPTOPLASTY     per Dr. Melissa Montane   . TONSILLECTOMY    . TYMPANOPLASTY    . UPPER GASTROINTESTINAL ENDOSCOPY      Current Medications: Outpatient Medications Prior to Visit  Medication Sig Dispense Refill  . cetirizine (ZYRTEC) 10 MG tablet Take 10 mg by mouth daily.    . furosemide (LASIX) 40 MG tablet Take 1 tablet (40 mg total) by mouth daily. 30 tablet 5  . levothyroxine (SYNTHROID) 75 MCG tablet TAKE 1 TABLET BY MOUTH EVERY DAY 90 tablet 1  . pantoprazole (PROTONIX) 40 MG tablet Take 1 tablet (40 mg total) by mouth 2 (two) times daily before a meal. 60 tablet 2  . rOPINIRole (REQUIP) 1 MG tablet TAKE 1 TABLET (1 MG TOTAL) BY MOUTH AT BEDTIME. (Patient not taking: Reported on 01/06/2021) 90 tablet 3  . triamcinolone cream (KENALOG) 0.1 % Apply 1 application topically 2 (two) times daily. (Patient not taking: Reported on 01/06/2021) 45 g 5   No facility-administered medications prior to visit.     Allergies:  Other, Neomycin-bacitracin zn-polymyx, Vagisil [anti-itch vaginal], and Pramoxine   Social History   Socioeconomic History  . Marital status: Married    Spouse name: Not on file  . Number of children: Not on file  . Years of education: Not on file  . Highest education level: Not on file  Occupational History  . Not on file  Tobacco Use  . Smoking status: Former Research scientist (life sciences)  . Smokeless tobacco: Never Used  Substance and Sexual Activity  . Alcohol use: Yes    Alcohol/week: 4.0 standard drinks    Types: 4 Standard drinks or equivalent per week    Comment: occ  . Drug use: No  .  Sexual activity: Not on file    Comment: works as an Forensic psychologist  Other Topics Concern  . Not on file  Social History Narrative  . Not on file   Social Determinants of Health   Financial Resource Strain: Not on file  Food Insecurity: Not on file  Transportation Needs: Not on file  Physical Activity: Not on file  Stress: Not on file  Social Connections: Not on file    Socially she is married for 33 years.  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  She received her JD degree at [employer].  She has 2 children, ages 24 and 31.  Family History:  The patient's family history includes Breast cancer in her maternal grandmother and paternal grandmother; Diabetes in her maternal grandmother.  Mother is currently living at age 77.  Unfortunately her father died at 79 with suicide.  There is family history of breast cancer in grandparents.  She has 6 sisters.Marland Kitchen  ROS General: Negative; No fevers, chills, or night sweats;  HEENT: Negative; No changes in vision or hearing, sinus congestion, difficulty swallowing Pulmonary: Negative; No cough, wheezing, shortness of breath, hemoptysis Cardiovascular: See HPI; No chest pain, presyncope, syncope, palpitations History of Raynaud's phenomenon of her fingers GI: Lymphocytic colitis GU: Negative; No dysuria, hematuria, or difficulty voiding Musculoskeletal: Negative; no myalgias, joint pain, or weakness;  Hematologic/Oncology: Negative; no easy bruising, bleeding Endocrine: Negative; no heat/cold intolerance; no diabetes Neuro: Negative; no changes in balance, headaches Skin: Negative; No rashes or skin lesions Psychiatric: Negative; No behavioral problems, depression Sleep: Negative; No snoring, daytime sleepiness, hypersomnolence, bruxism, restless legs, hypnogognic hallucinations, no cataplexy Other comprehensive 14 point system review is negative.   PHYSICAL EXAM:   VS:  BP 110/82   Pulse  (!) 57   Ht '5\' 9"'  (1.753 m)   Wt 151 lb 9.6 oz (68.8 kg)   SpO2 98%   BMI 22.39 kg/m     Repeat blood pressure by me 110/80  Wt Readings from Last 3 Encounters:  01/06/21 151 lb 9.6 oz (68.8 kg)  11/18/20 154 lb (69.9 kg)  09/16/20 159 lb (72.1 kg)    General: Alert, oriented, no distress.  Skin: normal turgor, no rashes, warm and dry HEENT: Normocephalic, atraumatic. Pupils equal round and reactive to light; sclera anicteric; extraocular muscles intact;  Nose without nasal septal hypertrophy Mouth/Parynx benign; Mallinpatti scale 2 Neck: No JVD, no carotid bruits; normal carotid upstroke Lungs: clear to ausculatation and percussion; no wheezing or rales Chest wall: without tenderness to palpitation Heart: PMI not displaced, RRR, s1 s2 normal, 1/6 systolic murmur, no diastolic murmur, no rubs, gallops, thrills, or heaves Abdomen: soft, nontender; no hepatosplenomehaly, BS+; abdominal aorta nontender and not dilated by palpation. Back: no CVA tenderness Pulses 2+  Musculoskeletal: full range of motion, normal strength, no joint deformities Extremities: Dependent rubor with mild residual feet swelling right leg greater than left; no clubbing cyanosis, Homan's sign negative  Neurologic: grossly nonfocal; Cranial nerves grossly wnl Psychologic: Normal mood and affect   Studies/Labs Reviewed:   EKG:  EKG is ordered today.  ECG (independently read by me): Sinus bradycardia 58 bpm, possible biatrial enlargement.  Recent Labs: BMP Latest Ref Rng & Units 09/16/2020 01/29/2020 01/20/2016  Glucose 70 - 99 mg/dL 76 67(L) 78  BUN 6 - 23 mg/dL '17 14 7  ' Creatinine 0.40 - 1.20 mg/dL 0.73 0.77 0.71  Sodium 135 - 145 mEq/L 138 137 140  Potassium 3.5 - 5.1 mEq/L 4.1 4.3 4.2  Chloride 96 - 112 mEq/L 103 102 106  CO2 19 - 32 mEq/L '31 30 29  ' Calcium 8.4 - 10.5 mg/dL 9.3 9.3 9.2     Hepatic Function Latest Ref Rng & Units 01/29/2020 01/20/2016 03/12/2014  Total Protein 6.0 - 8.3 g/dL 6.3 6.6 6.7   Albumin 3.5 - 5.2 g/dL 4.3 4.5 4.1  AST 0 - 37 U/L '18 18 17  ' ALT 0 - 35 U/L '15 13 14  ' Alk Phosphatase 39 - 117 U/L 34(L) 34(L) 37(L)  Total Bilirubin 0.2 - 1.2 mg/dL 0.5 0.7 0.6  Bilirubin, Direct 0.0 - 0.3 mg/dL 0.1 0.2 0.1    CBC Latest Ref Rng & Units 01/29/2020 02/28/2016 01/20/2016  WBC 4.0 - 10.5 K/uL 2.9(L) 3.8(L) 2.8(L)  Hemoglobin 12.0 - 15.0 g/dL 13.3 13.3 13.0  Hematocrit 36.0 - 46.0 % 40.3 41.2 40.2  Platelets 150.0 - 400.0 K/uL 210.0 230 210.0   Lab Results  Component Value Date   MCV 88.1 01/29/2020   MCV 87.2 02/28/2016   MCV 87.0 01/20/2016   Lab Results  Component Value Date   TSH 1.37 09/16/2020   No results found for: HGBA1C   BNP No results found for: BNP  ProBNP No results found for: PROBNP   Lipid Panel     Component Value Date/Time   CHOL 164 01/29/2020 0829   TRIG 32.0 01/29/2020 0829   HDL 66.80 01/29/2020 0829   CHOLHDL 2 01/29/2020 0829   VLDL 6.4 01/29/2020 0829   LDLCALC 91 01/29/2020 0829     RADIOLOGY: No results found.   Additional studies/ records that were reviewed today include:  I have reviewed the records of Dr. Alysia Penna  ASSESSMENT:    1. Lower extremity edema   2. Dependent rubor   3. 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  Since the third week in January, she has experienced leg swelling bilaterally, right greater than left and for this reason was started on furosemide initially at 20 mg.  However due to continued swelling her dose was further titrated to 40 mg daily.  Swelling has improved but she still notes swelling being worse during the evening.  In addition with dependence she is aware of dependent rubor.  She also has a history of Raynaud's phenomenon involving her fingers.  Presently, I am recommending she undergo a 2D echo Doppler study to assess  LV systolic and diastolic function, RV function, valvular architecture and pulmonary pressures.  I am scheduling her for lower extremity venous Doppler study to assess for venous insufficiency which may be playing a role in her continued swelling with dependent rubor.  I am recommending support stockings with 20 to 30 mm of pressure support.  She has undergone a comprehensive rheumatologic evaluation with Dr. Amil Amen and by her report comprehensive laboratory was all essentially normal.  She has a history of hypothyroidism and currently is on levothyroxine.  She takes Zyrtec on a as needed basis as well as pantoprazole.  I will see her in follow-up in several months and further recommendations will be made at that time.   Medication Adjustments/Labs and Tests Ordered: Current medicines are reviewed at length with the patient today.  Concerns regarding medicines are outlined above.  Medication changes, Labs and Tests ordered today are listed in the Patient Instructions below. Patient Instructions  Medication Instructions:  The current medical regimen is effective;  continue present plan and medications as directed. Please refer to the Current Medication list given to you today. *If you need a refill on your cardiac medications before your next appointment, please call your pharmacy*  Lab Work: NONE  Testing/Procedures: Echocardiogram - Your physician has requested that you have an echocardiogram. Echocardiography is a painless test that uses sound waves to create images of your heart. It provides your doctor with information about the size and shape of your heart and how well your heart's chambers and valves are working. This procedure takes approximately one hour. There are no restrictions for this procedure. This will be performed at our [facility] location - [address].  Your physician has requested that you have a lower extremity venous duplex. This test is an ultrasound of the  veins in the legs or arms. It looks at venous blood flow that carries blood from the heart to the legs or arms. Allow one hour for a Lower Venous exam. Allow thirty minutes for an Upper Venous exam. There are no restrictions or special instructions.  Follow-Up: Your next appointment:  2 month(s) In Person with Shelva Majestic, MD    At [facility], you and your health needs are our priority.  As part of our continuing mission to provide you with exceptional heart care, we have created designated Provider Care Teams.  These Care Teams include your primary Cardiologist (physician) and Advanced Practice Providers (APPs -  Physician Assistants and Nurse Practitioners) who all work together to provide you with the care you need, when you need it.     Signed, Shelva Majestic, MD  01/13/2021 3:22 PM    [facility] [address] [place], [place], [place]  [address] Phone: [phone]

## 2021-01-13 ENCOUNTER — Encounter: Payer: Self-pay | Admitting: Cardiovascular Disease

## 2021-01-13 ENCOUNTER — Other Ambulatory Visit: Payer: BC Managed Care – PPO

## 2021-01-14 DIAGNOSIS — M47817 Spondylosis without myelopathy or radiculopathy, lumbosacral region: Secondary | ICD-10-CM | POA: Diagnosis not present

## 2021-01-14 DIAGNOSIS — M9905 Segmental and somatic dysfunction of pelvic region: Secondary | ICD-10-CM | POA: Diagnosis not present

## 2021-01-14 DIAGNOSIS — M47816 Spondylosis without myelopathy or radiculopathy, lumbar region: Secondary | ICD-10-CM | POA: Diagnosis not present

## 2021-01-14 DIAGNOSIS — M9901 Segmental and somatic dysfunction of cervical region: Secondary | ICD-10-CM | POA: Diagnosis not present

## 2021-01-19 ENCOUNTER — Ambulatory Visit (HOSPITAL_COMMUNITY)
Admission: RE | Admit: 2021-01-19 | Discharge: 2021-01-19 | Disposition: A | Discharge status: Home / Self Care | Payer: BC Managed Care – PPO | Source: Ambulatory Visit | Attending: Cardiovascular Disease | Admitting: Cardiovascular Disease

## 2021-01-19 ENCOUNTER — Other Ambulatory Visit: Payer: Self-pay

## 2021-01-19 DIAGNOSIS — L539 Erythematous condition, unspecified: Secondary | ICD-10-CM | POA: Diagnosis not present

## 2021-01-19 DIAGNOSIS — R600 Localized edema: Secondary | ICD-10-CM | POA: Diagnosis not present

## 2021-01-24 ENCOUNTER — Encounter: Payer: Self-pay | Admitting: Family Medicine

## 2021-01-25 ENCOUNTER — Other Ambulatory Visit: Payer: Self-pay | Admitting: Obstetrics and Gynecology

## 2021-01-25 ENCOUNTER — Ambulatory Visit
Admission: RE | Admit: 2021-01-25 | Discharge: 2021-01-25 | Disposition: A | Discharge status: Home / Self Care | Payer: BC Managed Care – PPO | Source: Ambulatory Visit | Attending: Obstetrics and Gynecology | Admitting: Obstetrics and Gynecology

## 2021-01-25 ENCOUNTER — Other Ambulatory Visit: Payer: Self-pay

## 2021-01-25 DIAGNOSIS — R928 Other abnormal and inconclusive findings on diagnostic imaging of breast: Secondary | ICD-10-CM

## 2021-01-25 DIAGNOSIS — N6489 Other specified disorders of breast: Secondary | ICD-10-CM | POA: Diagnosis not present

## 2021-01-26 DIAGNOSIS — Z803 Family history of malignant neoplasm of breast: Secondary | ICD-10-CM | POA: Diagnosis not present

## 2021-01-26 MED ORDER — DIAZEPAM 5 MG PO TABS: 5.0000 mg | ORAL_TABLET | Freq: Two times a day (BID) | ORAL | 0 refills | Status: DC | PRN

## 2021-01-26 NOTE — Telephone Encounter (Signed)
I ordered the Valium

## 2021-01-28 ENCOUNTER — Other Ambulatory Visit (HOSPITAL_COMMUNITY): Payer: BC Managed Care – PPO

## 2021-02-08 DIAGNOSIS — M47816 Spondylosis without myelopathy or radiculopathy, lumbar region: Secondary | ICD-10-CM | POA: Diagnosis not present

## 2021-02-08 DIAGNOSIS — M9901 Segmental and somatic dysfunction of cervical region: Secondary | ICD-10-CM | POA: Diagnosis not present

## 2021-02-08 DIAGNOSIS — M47817 Spondylosis without myelopathy or radiculopathy, lumbosacral region: Secondary | ICD-10-CM | POA: Diagnosis not present

## 2021-02-08 DIAGNOSIS — M9905 Segmental and somatic dysfunction of pelvic region: Secondary | ICD-10-CM | POA: Diagnosis not present

## 2021-02-18 ENCOUNTER — Other Ambulatory Visit (HOSPITAL_COMMUNITY): Payer: BC Managed Care – PPO

## 2021-03-01 ENCOUNTER — Other Ambulatory Visit: Payer: Self-pay

## 2021-03-01 ENCOUNTER — Ambulatory Visit (HOSPITAL_COMMUNITY): Payer: BC Managed Care – PPO | Attending: Cardiovascular Disease

## 2021-03-01 DIAGNOSIS — L539 Erythematous condition, unspecified: Secondary | ICD-10-CM | POA: Diagnosis not present

## 2021-03-01 DIAGNOSIS — R600 Localized edema: Secondary | ICD-10-CM

## 2021-03-01 LAB — ECHOCARDIOGRAM COMPLETE
Area-P 1/2: 2.6 cm2
S' Lateral: 2.9 cm

## 2021-03-07 ENCOUNTER — Encounter: Payer: Self-pay | Admitting: Orthopaedic Surgery

## 2021-03-07 ENCOUNTER — Ambulatory Visit (INDEPENDENT_AMBULATORY_CARE_PROVIDER_SITE_OTHER): Payer: BC Managed Care – PPO | Admitting: Orthopaedic Surgery

## 2021-03-07 ENCOUNTER — Other Ambulatory Visit: Payer: Self-pay

## 2021-03-07 DIAGNOSIS — M7701 Medial epicondylitis, right elbow: Secondary | ICD-10-CM | POA: Diagnosis not present

## 2021-03-07 DIAGNOSIS — R0781 Pleurodynia: Secondary | ICD-10-CM | POA: Diagnosis not present

## 2021-03-07 DIAGNOSIS — R0789 Other chest pain: Secondary | ICD-10-CM | POA: Diagnosis not present

## 2021-03-07 MED ORDER — LIDOCAINE HCL 1 % IJ SOLN
1.0000 mL | INTRAMUSCULAR | Status: AC | PRN
  Administered 2021-03-07: 1 mL

## 2021-03-07 MED ORDER — METHYLPREDNISOLONE ACETATE 40 MG/ML IJ SUSP
1.0000 mg | INTRAMUSCULAR | Status: AC | PRN
  Administered 2021-03-07: 1 mg

## 2021-03-07 NOTE — Progress Notes (Signed)
Office Visit Note   Patient: Patricia Moyer           Date of Birth: [birth year]           MRN: [id number] Visit Date: 03/07/2021              Requested by: Laurey Morale, MD [place],  [place] [address] PCP: Laurey Morale, MD   Assessment & Plan: Visit Diagnoses:  1. Sternal pain   2. Rib pain on right side   3. Medial epicondylitis of right elbow     Plan: For her right elbow medial epicondylitis, I recommended a steroid injection and Voltaren gel.  She agrees this treatment plan and tolerated steroid injection well.  She should actually try Voltaren gel over the area of her sternum and ribs that hurt.  At this point given the chronic nature of her pain, a MRI of her chest is warranted to assess for stress fracture that is not healing that then can help determine a better treatment plan for her.  I think this is medically reasonable at this standpoint given the failure of conservative treatment that she has had for well over 7 months now.  We will see her back after this chest MRI.  Follow-Up Instructions: No follow-ups on file.   Orders:  Orders Placed This Encounter  Procedures   Hand/UE Inj   No orders of the defined types were placed in this encounter.     Procedures: Hand/UE Inj: R elbow for medial epicondylitis on 03/07/2021 5:31 PM Medications: 1 mL lidocaine 1 %; 1 mg methylPREDNISolone acetate 40 MG/ML     Clinical Data: No additional findings.   Subjective: Chief Complaint  Patient presents with   Chest - Pain  The patient comes in today with 2 chief complaints.  Her right elbow has been hurting over the medial epicondyle area for some time now.  She denies any specific injuries or repetitive activities.  She points to the medial epicondyle as a source of her pain.  She denies any weakness in that upper extremity or numbness and tingling in her hand.  What is been more painful to her is right-sided chest pain just to the right scapular  border along the ribs.  She states that she had a specific incident last year when she sneezed or coughed really hard and felt a pop to the right side of her chest.  She has had pain in that area ever since then and has not gotten better at all.  She has had chest x-rays were normal.  She still hurts in that area along her sternum and ribs on the right side with deep breathing and coughing and has become very irritating for her.  She is not a smoker not a diabetic.  The mechanical musculoskeletal pain she is having along the right side of her sternum is now affecting her posture when she tries to walk or stand different to keep herself from hurting in that area of her chest.  She does have a chronic cough on occasion and this irritates this area of her chest.  HPI  Review of Systems   Objective: Vital Signs: There were no vitals taken for this visit.  Physical Exam She is alert and orient x3 and in no acute distress Ortho Exam Examination of her right elbow does show pain over the medial epicondyle.  There is no pain over the cubital tunnel and a negative Tinel's sign.  Her  range of motion is full and the elbow is ligamentously stable.  There is pain to palpation along the right side of her sternum around the ribs near the costochondral and sternal junctions. Specialty Comments:  No specialty comments available.  Imaging: No results found. Chest x-ray was reviewed and inconclusive.  PMFS History: Patient Active Problem List   Diagnosis Date Noted   Restless legs syndrome 01/29/2020   Hypothyroidism 10/03/2019   Raynaud's disease without gangrene 10/03/2019   Perennial allergic rhinitis 08/18/2019   Acute diffuse otitis externa of right ear 08/18/2019   Chronic leukopenia 02/28/2016   Eczema 01/31/2016   Tarsal tunnel syndrome of right side 03/19/2014   Esophageal reflux 05/30/2012   History of melanoma in situ 04/17/2008   TMJ SYNDROME 04/17/2008   Past Medical History:   Diagnosis Date   Allergy    Chronic diarrhea    sees Dr. Jamey [facility]as at [facility]   GERD (gastroesophageal reflux disease)    Skin cancer 2005   in situ melanoma back, excision   Tarsal tunnel syndrome of right side    sees Dr. Herbie Drape    Thyroid disease    hypothyroidism    Family History  Problem Relation Age of Onset   Diabetes Maternal Grandmother    Breast cancer Maternal Grandmother        paternal grandmother   Breast cancer Paternal Grandmother    Colon cancer Neg Hx    Colon polyps Neg Hx    Rectal cancer Neg Hx    Stomach cancer Neg Hx     Past Surgical History:  Procedure Laterality Date   COLONOSCOPY  04/28/2016   per Dr. Loletha Carrow, clear, repeat in 10 yrs    ESOPHAGOGASTRODUODENOSCOPY  09-28-09   per Dr. Deatra Ina, reflux only    SEPTOPLASTY     per Dr. Melissa Montane    TONSILLECTOMY     TYMPANOPLASTY     UPPER GASTROINTESTINAL ENDOSCOPY     Social History   Occupational History   Not on file  Tobacco Use   Smoking status: Former   Smokeless tobacco: Never  Substance and Sexual Activity   Alcohol use: Yes    Alcohol/week: 4.0 standard drinks    Types: 4 Standard drinks or equivalent per week    Comment: occ   Drug use: No   Sexual activity: Not on file    Comment: works as an Forensic psychologist

## 2021-03-08 ENCOUNTER — Encounter: Payer: Self-pay | Admitting: Orthopaedic Surgery

## 2021-03-08 DIAGNOSIS — M9905 Segmental and somatic dysfunction of pelvic region: Secondary | ICD-10-CM | POA: Diagnosis not present

## 2021-03-08 DIAGNOSIS — M47817 Spondylosis without myelopathy or radiculopathy, lumbosacral region: Secondary | ICD-10-CM | POA: Diagnosis not present

## 2021-03-08 DIAGNOSIS — M47816 Spondylosis without myelopathy or radiculopathy, lumbar region: Secondary | ICD-10-CM | POA: Diagnosis not present

## 2021-03-08 DIAGNOSIS — M9901 Segmental and somatic dysfunction of cervical region: Secondary | ICD-10-CM | POA: Diagnosis not present

## 2021-03-09 ENCOUNTER — Telehealth: Payer: Self-pay | Admitting: Cardiovascular Disease

## 2021-03-09 ENCOUNTER — Other Ambulatory Visit: Payer: Self-pay

## 2021-03-09 DIAGNOSIS — R0782 Intercostal pain: Secondary | ICD-10-CM

## 2021-03-09 NOTE — Telephone Encounter (Signed)
Follow up:     Patient returning a call back foe results.

## 2021-03-10 ENCOUNTER — Other Ambulatory Visit: Payer: Self-pay | Admitting: Family Medicine

## 2021-03-10 NOTE — Telephone Encounter (Signed)
Patient calling back.   °

## 2021-03-10 NOTE — Telephone Encounter (Signed)
Called patient, advised that ECHO was normal.  Patient verbalized understanding.  She would like to know if he would still like to see her. I advised that last office note states to follow up in 2 months, but if testing was normal she does not know why she would need this. Would you like to push it out further or keep it at 2 months.   Thank you!

## 2021-03-15 NOTE — Telephone Encounter (Signed)
When I had initially seen her, dependent rubor also talked about venous insufficiency.  With her normal echo, you can defer follow-up if she would like to a later date.

## 2021-03-15 NOTE — Telephone Encounter (Signed)
Spoke with pt, aware of dr [facility] recommendations. She does not want a recall at this time and she will contact us if she has problems.

## 2021-03-17 IMAGING — US US BREAST*R* LIMITED INC AXILLA
1 series · 2 of 2 positions shown · non-contrast
Comparison: Previous exam(s).

CLINICAL DATA: The patient presents with a palpable lump in the
right breast.

EXAM:
DIGITAL DIAGNOSTIC RIGHT MAMMOGRAM WITH CAD AND TOMO
ULTRASOUND RIGHT BREAST

[Series 1: us breast*right* limited inc axilla · 0.05mm/px · 2 of 2 slices shown]
[im 1/2]
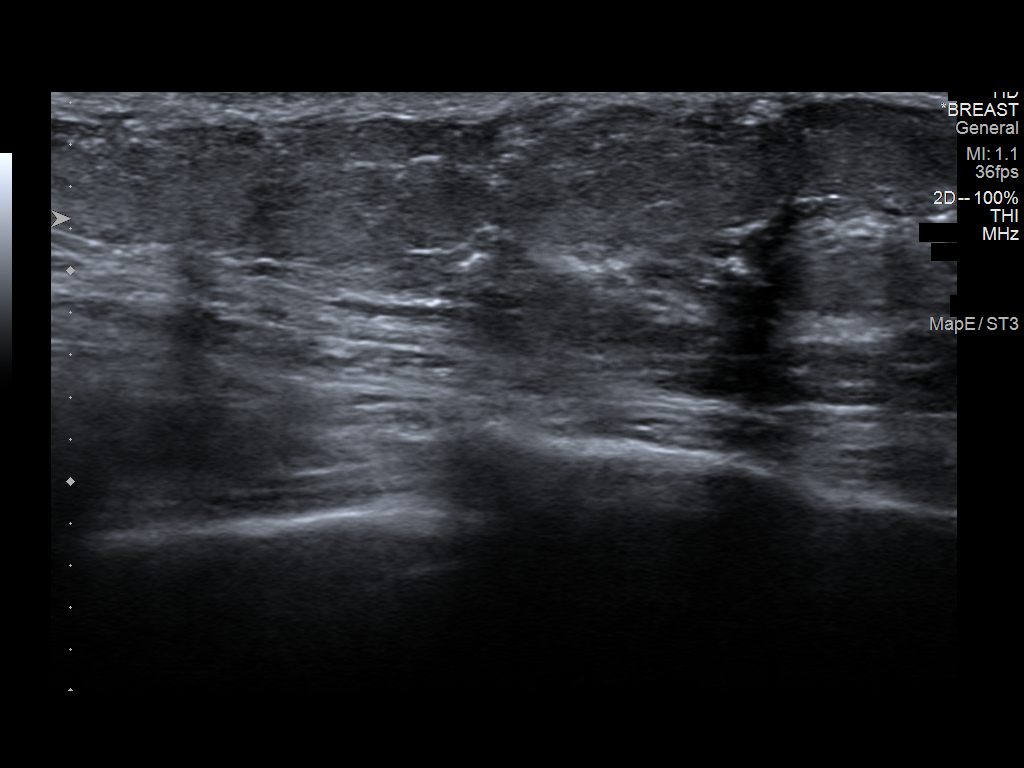
[im 2/2]
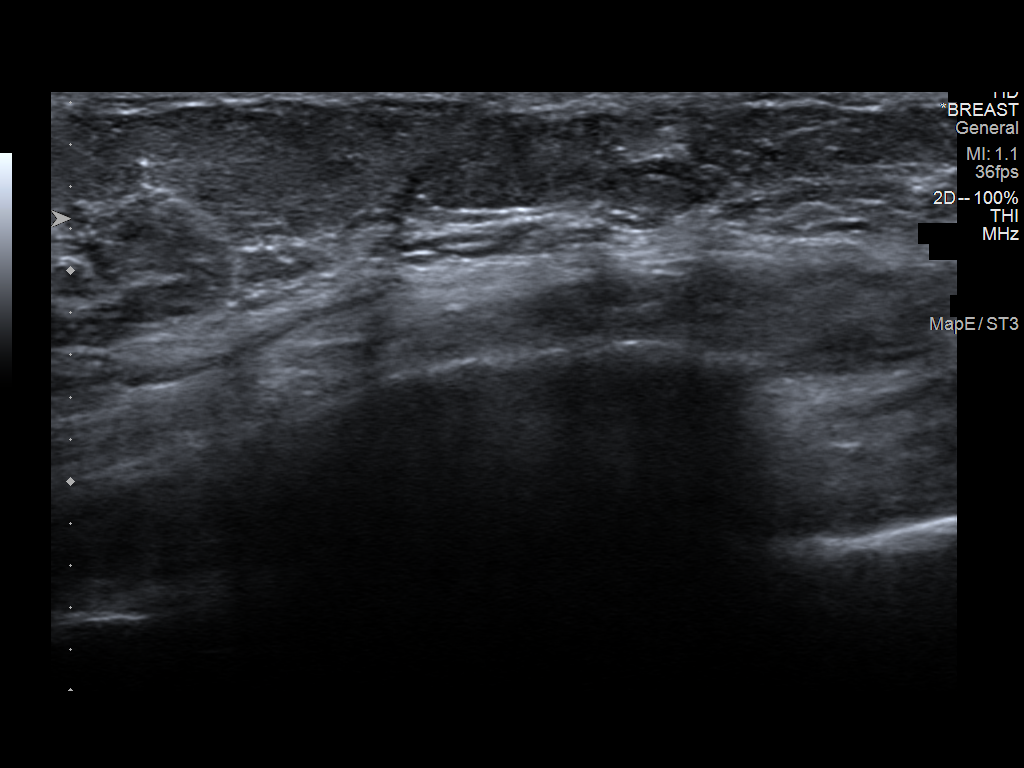

[2 of 2 positions shown; findings below may reference images not displayed]

ACR Breast Density Category c: The breast tissue is heterogeneously
dense, which may obscure small masses.
FINDINGS: No suspicious masses, calcifications, or distortion are identified
in the right breast.

Mammographic images were processed with CAD.

On physical exam, lumpiness is felt along the inframammary fold. The
patient feels a specific lump at approximately 6 o'clock. However,
there is similar lumpiness along the entirety of the inframammary
fold.

Targeted ultrasound is performed, showing normal tissue with no
evidence of malignancy.
IMPRESSION: No mammographic or sonographic evidence of malignancy in the right
breast.

RECOMMENDATION:
Treatment of the patient's lumpiness should be based on clinical and
physical exam given lack of imaging findings. Recommend annual
screening mammography due in Sunday September, 2019.

I have discussed the findings and recommendations with the patient.
If applicable, a reminder letter will be sent to the patient
regarding the next appointment.

BI-RADS CATEGORY  1: Negative.

## 2021-03-23 ENCOUNTER — Other Ambulatory Visit: Payer: Self-pay

## 2021-03-23 ENCOUNTER — Ambulatory Visit
Admission: RE | Admit: 2021-03-23 | Discharge: 2021-03-23 | Disposition: A | Discharge status: Home / Self Care | Payer: BC Managed Care – PPO | Source: Ambulatory Visit | Attending: Orthopaedic Surgery | Admitting: Orthopaedic Surgery

## 2021-03-23 DIAGNOSIS — Q676 Pectus excavatum: Secondary | ICD-10-CM | POA: Diagnosis not present

## 2021-03-23 DIAGNOSIS — R072 Precordial pain: Secondary | ICD-10-CM | POA: Diagnosis not present

## 2021-03-23 DIAGNOSIS — M19011 Primary osteoarthritis, right shoulder: Secondary | ICD-10-CM | POA: Diagnosis not present

## 2021-03-23 DIAGNOSIS — R0782 Intercostal pain: Secondary | ICD-10-CM

## 2021-03-23 DIAGNOSIS — M19012 Primary osteoarthritis, left shoulder: Secondary | ICD-10-CM | POA: Diagnosis not present

## 2021-04-06 ENCOUNTER — Encounter: Payer: Self-pay | Admitting: Orthopaedic Surgery

## 2021-04-06 ENCOUNTER — Ambulatory Visit (INDEPENDENT_AMBULATORY_CARE_PROVIDER_SITE_OTHER): Payer: BC Managed Care – PPO | Admitting: Orthopaedic Surgery

## 2021-04-06 DIAGNOSIS — R0782 Intercostal pain: Secondary | ICD-10-CM | POA: Diagnosis not present

## 2021-04-06 DIAGNOSIS — M9901 Segmental and somatic dysfunction of cervical region: Secondary | ICD-10-CM | POA: Diagnosis not present

## 2021-04-06 DIAGNOSIS — R0789 Other chest pain: Secondary | ICD-10-CM

## 2021-04-06 DIAGNOSIS — R0781 Pleurodynia: Secondary | ICD-10-CM | POA: Diagnosis not present

## 2021-04-06 DIAGNOSIS — M47817 Spondylosis without myelopathy or radiculopathy, lumbosacral region: Secondary | ICD-10-CM | POA: Diagnosis not present

## 2021-04-06 DIAGNOSIS — M47816 Spondylosis without myelopathy or radiculopathy, lumbar region: Secondary | ICD-10-CM | POA: Diagnosis not present

## 2021-04-06 DIAGNOSIS — M9905 Segmental and somatic dysfunction of pelvic region: Secondary | ICD-10-CM | POA: Diagnosis not present

## 2021-04-06 NOTE — Progress Notes (Signed)
The patient is following up for 2 different issues.  At her last visit I did inject the medial epicondyle area of her right elbow due to medial epicondylitis.  She said that has done well.  She has had a chronic issue with chest pain to the medial border of the sternum just below the sternoclavicular joint on the right side.  There is no right sided sternoclavicular pain and is just below this where she hurts.  When she is using a Nettie pot last year in the shower and was aggressive with that and when she first developed pain in this area.  It has gotten slightly better with time.  Voltaren gel did help but she developed a rash from the Voltaren gel.  We did send her for an MRI of her chest.  I did go over the MRI of the chest with her today.  There is some edema just to the medial border of the sternum that is consistent with where she hurts.  Examination of the right elbow shows minimal pain over the medial condyle with a full range of motion of her elbow and no instability.  I showed her the actual MRI images.  This did help her in terms of understanding what is driving the pain.  She was to work on activity modification and she is even done that with yoga in terms of activity modification.  There is no other treatment recommendations that I have for this other than hopefully it will calm down with time since it is slowly getting better.  All question concerns were answered and addressed.  Certainly a second opinion with one of our sports medicine and those guys would be worthwhile if it comes to her having this checked out further under ultrasound.  Still, I do not feel that this is the area that they would ever inject and she understands this as well.

## 2021-05-04 DIAGNOSIS — M47816 Spondylosis without myelopathy or radiculopathy, lumbar region: Secondary | ICD-10-CM | POA: Diagnosis not present

## 2021-05-04 DIAGNOSIS — M9901 Segmental and somatic dysfunction of cervical region: Secondary | ICD-10-CM | POA: Diagnosis not present

## 2021-05-04 DIAGNOSIS — M9905 Segmental and somatic dysfunction of pelvic region: Secondary | ICD-10-CM | POA: Diagnosis not present

## 2021-05-04 DIAGNOSIS — M47817 Spondylosis without myelopathy or radiculopathy, lumbosacral region: Secondary | ICD-10-CM | POA: Diagnosis not present

## 2021-05-05 ENCOUNTER — Other Ambulatory Visit: Payer: Self-pay | Admitting: Family Medicine

## 2021-06-03 DIAGNOSIS — M9901 Segmental and somatic dysfunction of cervical region: Secondary | ICD-10-CM | POA: Diagnosis not present

## 2021-06-03 DIAGNOSIS — M47817 Spondylosis without myelopathy or radiculopathy, lumbosacral region: Secondary | ICD-10-CM | POA: Diagnosis not present

## 2021-06-03 DIAGNOSIS — M9905 Segmental and somatic dysfunction of pelvic region: Secondary | ICD-10-CM | POA: Diagnosis not present

## 2021-06-03 DIAGNOSIS — M47816 Spondylosis without myelopathy or radiculopathy, lumbar region: Secondary | ICD-10-CM | POA: Diagnosis not present

## 2021-07-04 DIAGNOSIS — M9905 Segmental and somatic dysfunction of pelvic region: Secondary | ICD-10-CM | POA: Diagnosis not present

## 2021-07-04 DIAGNOSIS — M9901 Segmental and somatic dysfunction of cervical region: Secondary | ICD-10-CM | POA: Diagnosis not present

## 2021-07-04 DIAGNOSIS — M47816 Spondylosis without myelopathy or radiculopathy, lumbar region: Secondary | ICD-10-CM | POA: Diagnosis not present

## 2021-07-04 DIAGNOSIS — M47817 Spondylosis without myelopathy or radiculopathy, lumbosacral region: Secondary | ICD-10-CM | POA: Diagnosis not present

## 2021-07-20 ENCOUNTER — Encounter: Payer: Self-pay | Admitting: Family Medicine

## 2021-07-20 MED ORDER — SCOPOLAMINE 1 MG/3DAYS TD PT72: 1.0000 | MEDICATED_PATCH | TRANSDERMAL | 5 refills | Status: AC

## 2021-07-20 NOTE — Telephone Encounter (Signed)
I sent in for the patches for her and Gaspar Bidding

## 2021-07-25 DIAGNOSIS — M9905 Segmental and somatic dysfunction of pelvic region: Secondary | ICD-10-CM | POA: Diagnosis not present

## 2021-07-25 DIAGNOSIS — M9901 Segmental and somatic dysfunction of cervical region: Secondary | ICD-10-CM | POA: Diagnosis not present

## 2021-07-25 DIAGNOSIS — M47816 Spondylosis without myelopathy or radiculopathy, lumbar region: Secondary | ICD-10-CM | POA: Diagnosis not present

## 2021-07-25 DIAGNOSIS — M47817 Spondylosis without myelopathy or radiculopathy, lumbosacral region: Secondary | ICD-10-CM | POA: Diagnosis not present

## 2021-08-24 DIAGNOSIS — M9901 Segmental and somatic dysfunction of cervical region: Secondary | ICD-10-CM | POA: Diagnosis not present

## 2021-08-24 DIAGNOSIS — M47816 Spondylosis without myelopathy or radiculopathy, lumbar region: Secondary | ICD-10-CM | POA: Diagnosis not present

## 2021-08-24 DIAGNOSIS — M47817 Spondylosis without myelopathy or radiculopathy, lumbosacral region: Secondary | ICD-10-CM | POA: Diagnosis not present

## 2021-08-24 DIAGNOSIS — M9905 Segmental and somatic dysfunction of pelvic region: Secondary | ICD-10-CM | POA: Diagnosis not present

## 2021-08-26 DIAGNOSIS — M47817 Spondylosis without myelopathy or radiculopathy, lumbosacral region: Secondary | ICD-10-CM | POA: Diagnosis not present

## 2021-08-26 DIAGNOSIS — M9901 Segmental and somatic dysfunction of cervical region: Secondary | ICD-10-CM | POA: Diagnosis not present

## 2021-08-26 DIAGNOSIS — M47816 Spondylosis without myelopathy or radiculopathy, lumbar region: Secondary | ICD-10-CM | POA: Diagnosis not present

## 2021-08-26 DIAGNOSIS — M9905 Segmental and somatic dysfunction of pelvic region: Secondary | ICD-10-CM | POA: Diagnosis not present

## 2021-08-30 DIAGNOSIS — Z8719 Personal history of other diseases of the digestive system: Secondary | ICD-10-CM | POA: Diagnosis not present

## 2021-08-30 DIAGNOSIS — R197 Diarrhea, unspecified: Secondary | ICD-10-CM | POA: Diagnosis not present

## 2021-09-01 DIAGNOSIS — M47816 Spondylosis without myelopathy or radiculopathy, lumbar region: Secondary | ICD-10-CM | POA: Diagnosis not present

## 2021-09-01 DIAGNOSIS — M9901 Segmental and somatic dysfunction of cervical region: Secondary | ICD-10-CM | POA: Diagnosis not present

## 2021-09-01 DIAGNOSIS — M47817 Spondylosis without myelopathy or radiculopathy, lumbosacral region: Secondary | ICD-10-CM | POA: Diagnosis not present

## 2021-09-01 DIAGNOSIS — M9905 Segmental and somatic dysfunction of pelvic region: Secondary | ICD-10-CM | POA: Diagnosis not present

## 2021-09-06 ENCOUNTER — Other Ambulatory Visit: Payer: Self-pay | Admitting: Family Medicine

## 2021-09-06 DIAGNOSIS — M47817 Spondylosis without myelopathy or radiculopathy, lumbosacral region: Secondary | ICD-10-CM | POA: Diagnosis not present

## 2021-09-06 DIAGNOSIS — M9901 Segmental and somatic dysfunction of cervical region: Secondary | ICD-10-CM | POA: Diagnosis not present

## 2021-09-06 DIAGNOSIS — M9905 Segmental and somatic dysfunction of pelvic region: Secondary | ICD-10-CM | POA: Diagnosis not present

## 2021-09-06 DIAGNOSIS — M47816 Spondylosis without myelopathy or radiculopathy, lumbar region: Secondary | ICD-10-CM | POA: Diagnosis not present

## 2021-09-16 DIAGNOSIS — M47816 Spondylosis without myelopathy or radiculopathy, lumbar region: Secondary | ICD-10-CM | POA: Diagnosis not present

## 2021-09-16 DIAGNOSIS — M9901 Segmental and somatic dysfunction of cervical region: Secondary | ICD-10-CM | POA: Diagnosis not present

## 2021-09-16 DIAGNOSIS — M47817 Spondylosis without myelopathy or radiculopathy, lumbosacral region: Secondary | ICD-10-CM | POA: Diagnosis not present

## 2021-09-16 DIAGNOSIS — M9905 Segmental and somatic dysfunction of pelvic region: Secondary | ICD-10-CM | POA: Diagnosis not present

## 2021-09-19 ENCOUNTER — Ambulatory Visit
Admission: RE | Admit: 2021-09-19 | Discharge: 2021-09-19 | Disposition: A | Discharge status: Home / Self Care | Payer: BC Managed Care – PPO | Source: Ambulatory Visit | Attending: Obstetrics and Gynecology | Admitting: Obstetrics and Gynecology

## 2021-09-19 DIAGNOSIS — Z9189 Other specified personal risk factors, not elsewhere classified: Secondary | ICD-10-CM

## 2021-09-19 DIAGNOSIS — Z1239 Encounter for other screening for malignant neoplasm of breast: Secondary | ICD-10-CM | POA: Diagnosis not present

## 2021-09-19 MED ORDER — GADOBUTROL 1 MMOL/ML IV SOLN
7.0000 mL | Freq: Once | INTRAVENOUS | Status: AC | PRN
  Administered 2021-09-19: 7 mL via INTRAVENOUS

## 2021-09-21 DIAGNOSIS — E063 Autoimmune thyroiditis: Secondary | ICD-10-CM | POA: Diagnosis not present

## 2021-09-21 DIAGNOSIS — E639 Nutritional deficiency, unspecified: Secondary | ICD-10-CM | POA: Diagnosis not present

## 2021-09-21 DIAGNOSIS — E559 Vitamin D deficiency, unspecified: Secondary | ICD-10-CM | POA: Diagnosis not present

## 2021-09-21 DIAGNOSIS — E039 Hypothyroidism, unspecified: Secondary | ICD-10-CM | POA: Diagnosis not present

## 2021-09-21 DIAGNOSIS — R5383 Other fatigue: Secondary | ICD-10-CM | POA: Diagnosis not present

## 2021-09-21 DIAGNOSIS — Z7689 Persons encountering health services in other specified circumstances: Secondary | ICD-10-CM | POA: Diagnosis not present

## 2021-09-21 DIAGNOSIS — K521 Toxic gastroenteritis and colitis: Secondary | ICD-10-CM | POA: Diagnosis not present

## 2021-09-21 DIAGNOSIS — E7212 Methylenetetrahydrofolate reductase deficiency: Secondary | ICD-10-CM | POA: Diagnosis not present

## 2021-09-21 DIAGNOSIS — E782 Mixed hyperlipidemia: Secondary | ICD-10-CM | POA: Diagnosis not present

## 2021-09-26 ENCOUNTER — Other Ambulatory Visit: Payer: BC Managed Care – PPO

## 2021-10-06 DIAGNOSIS — M47816 Spondylosis without myelopathy or radiculopathy, lumbar region: Secondary | ICD-10-CM | POA: Diagnosis not present

## 2021-10-06 DIAGNOSIS — M9905 Segmental and somatic dysfunction of pelvic region: Secondary | ICD-10-CM | POA: Diagnosis not present

## 2021-10-06 DIAGNOSIS — M9901 Segmental and somatic dysfunction of cervical region: Secondary | ICD-10-CM | POA: Diagnosis not present

## 2021-10-06 DIAGNOSIS — M47817 Spondylosis without myelopathy or radiculopathy, lumbosacral region: Secondary | ICD-10-CM | POA: Diagnosis not present

## 2021-11-03 DIAGNOSIS — M47817 Spondylosis without myelopathy or radiculopathy, lumbosacral region: Secondary | ICD-10-CM | POA: Diagnosis not present

## 2021-11-03 DIAGNOSIS — M47816 Spondylosis without myelopathy or radiculopathy, lumbar region: Secondary | ICD-10-CM | POA: Diagnosis not present

## 2021-11-03 DIAGNOSIS — M9905 Segmental and somatic dysfunction of pelvic region: Secondary | ICD-10-CM | POA: Diagnosis not present

## 2021-11-03 DIAGNOSIS — M9901 Segmental and somatic dysfunction of cervical region: Secondary | ICD-10-CM | POA: Diagnosis not present

## 2021-12-01 DIAGNOSIS — M47817 Spondylosis without myelopathy or radiculopathy, lumbosacral region: Secondary | ICD-10-CM | POA: Diagnosis not present

## 2021-12-01 DIAGNOSIS — M9905 Segmental and somatic dysfunction of pelvic region: Secondary | ICD-10-CM | POA: Diagnosis not present

## 2021-12-01 DIAGNOSIS — M9901 Segmental and somatic dysfunction of cervical region: Secondary | ICD-10-CM | POA: Diagnosis not present

## 2021-12-01 DIAGNOSIS — M47816 Spondylosis without myelopathy or radiculopathy, lumbar region: Secondary | ICD-10-CM | POA: Diagnosis not present

## 2021-12-21 ENCOUNTER — Other Ambulatory Visit: Payer: Self-pay | Admitting: Family Medicine

## 2021-12-21 DIAGNOSIS — K521 Toxic gastroenteritis and colitis: Secondary | ICD-10-CM

## 2021-12-21 DIAGNOSIS — R1010 Upper abdominal pain, unspecified: Secondary | ICD-10-CM

## 2021-12-21 DIAGNOSIS — K529 Noninfective gastroenteritis and colitis, unspecified: Secondary | ICD-10-CM

## 2021-12-21 DIAGNOSIS — R946 Abnormal results of thyroid function studies: Secondary | ICD-10-CM | POA: Diagnosis not present

## 2021-12-21 DIAGNOSIS — R799 Abnormal finding of blood chemistry, unspecified: Secondary | ICD-10-CM | POA: Diagnosis not present

## 2021-12-21 DIAGNOSIS — E039 Hypothyroidism, unspecified: Secondary | ICD-10-CM | POA: Diagnosis not present

## 2021-12-21 DIAGNOSIS — E063 Autoimmune thyroiditis: Secondary | ICD-10-CM | POA: Diagnosis not present

## 2021-12-21 DIAGNOSIS — E559 Vitamin D deficiency, unspecified: Secondary | ICD-10-CM | POA: Diagnosis not present

## 2021-12-21 DIAGNOSIS — D6489 Other specified anemias: Secondary | ICD-10-CM | POA: Diagnosis not present

## 2021-12-26 ENCOUNTER — Ambulatory Visit
Admission: RE | Admit: 2021-12-26 | Discharge: 2021-12-26 | Disposition: A | Discharge status: Home / Self Care | Payer: BC Managed Care – PPO | Source: Ambulatory Visit | Attending: Family Medicine | Admitting: Family Medicine

## 2021-12-26 DIAGNOSIS — K521 Toxic gastroenteritis and colitis: Secondary | ICD-10-CM

## 2021-12-26 DIAGNOSIS — R1010 Upper abdominal pain, unspecified: Secondary | ICD-10-CM | POA: Diagnosis not present

## 2021-12-26 DIAGNOSIS — K529 Noninfective gastroenteritis and colitis, unspecified: Secondary | ICD-10-CM

## 2022-01-16 ENCOUNTER — Inpatient Hospital Stay: Admission: RE | Admit: 2022-01-16 | Payer: BC Managed Care – PPO | Source: Ambulatory Visit

## 2022-01-17 DIAGNOSIS — Z8619 Personal history of other infectious and parasitic diseases: Secondary | ICD-10-CM | POA: Diagnosis not present

## 2022-01-17 DIAGNOSIS — K52832 Lymphocytic colitis: Secondary | ICD-10-CM | POA: Diagnosis not present

## 2022-01-17 DIAGNOSIS — R1031 Right lower quadrant pain: Secondary | ICD-10-CM | POA: Diagnosis not present

## 2022-01-18 DIAGNOSIS — A048 Other specified bacterial intestinal infections: Secondary | ICD-10-CM | POA: Diagnosis not present

## 2022-02-02 DIAGNOSIS — K52832 Lymphocytic colitis: Secondary | ICD-10-CM | POA: Diagnosis not present

## 2022-02-07 DIAGNOSIS — Z6824 Body mass index (BMI) 24.0-24.9, adult: Secondary | ICD-10-CM | POA: Diagnosis not present

## 2022-02-07 DIAGNOSIS — Z01419 Encounter for gynecological examination (general) (routine) without abnormal findings: Secondary | ICD-10-CM | POA: Diagnosis not present

## 2022-02-07 DIAGNOSIS — Z1231 Encounter for screening mammogram for malignant neoplasm of breast: Secondary | ICD-10-CM | POA: Diagnosis not present

## 2022-02-10 DIAGNOSIS — M9901 Segmental and somatic dysfunction of cervical region: Secondary | ICD-10-CM | POA: Diagnosis not present

## 2022-02-10 DIAGNOSIS — M9905 Segmental and somatic dysfunction of pelvic region: Secondary | ICD-10-CM | POA: Diagnosis not present

## 2022-02-10 DIAGNOSIS — M47817 Spondylosis without myelopathy or radiculopathy, lumbosacral region: Secondary | ICD-10-CM | POA: Diagnosis not present

## 2022-02-10 DIAGNOSIS — M47816 Spondylosis without myelopathy or radiculopathy, lumbar region: Secondary | ICD-10-CM | POA: Diagnosis not present

## 2022-02-21 ENCOUNTER — Encounter: Payer: Self-pay | Admitting: Family Medicine

## 2022-02-21 MED ORDER — TRIAZOLAM 0.25 MG PO TABS: ORAL_TABLET | ORAL | 0 refills | Status: AC

## 2022-02-21 NOTE — Telephone Encounter (Signed)
Done

## 2022-02-27 ENCOUNTER — Other Ambulatory Visit: Payer: Self-pay | Admitting: Family Medicine

## 2022-03-01 ENCOUNTER — Encounter: Payer: Self-pay | Admitting: Family Medicine

## 2022-03-01 DIAGNOSIS — G4700 Insomnia, unspecified: Secondary | ICD-10-CM | POA: Insufficient documentation

## 2022-03-06 DIAGNOSIS — A048 Other specified bacterial intestinal infections: Secondary | ICD-10-CM | POA: Diagnosis not present

## 2022-03-08 ENCOUNTER — Telehealth: Payer: Self-pay | Admitting: Family Medicine

## 2022-03-08 ENCOUNTER — Other Ambulatory Visit: Payer: Self-pay

## 2022-03-08 DIAGNOSIS — E039 Hypothyroidism, unspecified: Secondary | ICD-10-CM

## 2022-03-08 MED ORDER — LEVOTHYROXINE SODIUM 75 MCG PO TABS: 75.0000 ug | ORAL_TABLET | Freq: Every day | ORAL | 0 refills | Status: DC

## 2022-03-08 NOTE — Telephone Encounter (Signed)
Spoke with patient, 30 day supply for Levothyroxine 75 mcg sent to [facility].   Patient will call office to schedule appointment for physical.

## 2022-03-08 NOTE — Telephone Encounter (Signed)
Pt would like a refill of:  levothyroxine (SYNTHROID) 75 MCG tablet  Pt's last OV was 11/18/2020  Pt is asking if she needs to have an OV first?  Please advise.    Please send to: [facility] [id number] - [place], [address] Phone:  [phone]  Fax:  [phone]

## 2022-03-21 ENCOUNTER — Ambulatory Visit (INDEPENDENT_AMBULATORY_CARE_PROVIDER_SITE_OTHER): Payer: BC Managed Care – PPO | Admitting: Family Medicine

## 2022-03-21 ENCOUNTER — Encounter: Payer: Self-pay | Admitting: Family Medicine

## 2022-03-21 VITALS — BP 118/78 | HR 78 | Temp 98.6°F | Ht 69.0 in | Wt 156.0 lb

## 2022-03-21 DIAGNOSIS — Z0000 Encounter for general adult medical examination without abnormal findings: Secondary | ICD-10-CM | POA: Diagnosis not present

## 2022-03-21 DIAGNOSIS — E039 Hypothyroidism, unspecified: Secondary | ICD-10-CM | POA: Diagnosis not present

## 2022-03-21 MED ORDER — LEVOTHYROXINE SODIUM 75 MCG PO TABS: 75.0000 ug | ORAL_TABLET | Freq: Every day | ORAL | 3 refills | Status: AC

## 2022-03-21 NOTE — Progress Notes (Signed)
Subjective:    Patient ID: Patricia Moyer, female    DOB: 1966-05-02, 56 y.o.   MRN: [id number]  HPI Here for a well exam. She feels fine other than her GI issues. She sees Dr. Jamey Reas of [facility] for lymphocytic colitis and H pylori positive GERD. She took a course of Budesonide in 2021 for the colitis and this helped quite a bit. Her cramps and diarrhea went away for about a year, then they returned. She is now on another slow taper of Budesonide, and again her diarrhea is improving. She also tested positive for H pylori on a stool test, so she is taking bismuth-metronidazole-tetracycline along with Pantoprazole. She has been following a gluten free and dairy free diet for the past 18 months. She exercises daily. Her weight is stable.    Review of Systems  Constitutional: Negative.   HENT: Negative.    Eyes: Negative.   Respiratory: Negative.    Cardiovascular: Negative.   Gastrointestinal:  Positive for abdominal pain and diarrhea.  Genitourinary:  Negative for decreased urine volume, difficulty urinating, dyspareunia, dysuria, enuresis, flank pain, frequency, hematuria, pelvic pain and urgency.  Musculoskeletal: Negative.   Skin: Negative.   Neurological: Negative.  Negative for headaches.  Psychiatric/Behavioral: Negative.         Objective:   Physical Exam Constitutional:      General: She is not in acute distress.    Appearance: Normal appearance. She is well-developed.  HENT:     Head: Normocephalic and atraumatic.     Right Ear: External ear normal.     Left Ear: External ear normal.     Nose: Nose normal.     Mouth/Throat:     Pharynx: No oropharyngeal exudate.  Eyes:     General: No scleral icterus.    Conjunctiva/sclera: Conjunctivae normal.     Pupils: Pupils are equal, round, and reactive to light.  Neck:     Thyroid: No thyromegaly.     Vascular: No JVD.  Cardiovascular:     Rate and Rhythm: Normal rate and regular rhythm.     Heart sounds: Normal heart  sounds. No murmur heard.    No friction rub. No gallop.  Pulmonary:     Effort: Pulmonary effort is normal. No respiratory distress.     Breath sounds: Normal breath sounds. No wheezing or rales.  Chest:     Chest wall: No tenderness.  Abdominal:     General: Bowel sounds are normal. There is no distension.     Palpations: Abdomen is soft. There is no mass.     Tenderness: There is no abdominal tenderness. There is no guarding or rebound.  Musculoskeletal:        General: No tenderness. Normal range of motion.     Cervical back: Normal range of motion and neck supple.  Lymphadenopathy:     Cervical: No cervical adenopathy.  Skin:    General: Skin is warm and dry.     Findings: No erythema or rash.  Neurological:     Mental Status: She is alert and oriented to person, place, and time.     Cranial Nerves: No cranial nerve deficit.     Motor: No abnormal muscle tone.     Coordination: Coordination normal.     Deep Tendon Reflexes: Reflexes are normal and symmetric. Reflexes normal.  Psychiatric:        Behavior: Behavior normal.        Thought Content: Thought content normal.  Judgment: Judgment normal.           Assessment & Plan:  Well exam. We discussed diet and exercise. Get fasting labs. Check her thyroid levels. Patricia Penna, MD

## 2022-03-23 ENCOUNTER — Other Ambulatory Visit (INDEPENDENT_AMBULATORY_CARE_PROVIDER_SITE_OTHER): Payer: BC Managed Care – PPO

## 2022-03-23 DIAGNOSIS — E039 Hypothyroidism, unspecified: Secondary | ICD-10-CM | POA: Diagnosis not present

## 2022-03-23 DIAGNOSIS — Z0000 Encounter for general adult medical examination without abnormal findings: Secondary | ICD-10-CM

## 2022-03-23 LAB — LIPID PANEL
Cholesterol: 180 mg/dL (ref 0–200)
HDL: 70.5 mg/dL (ref >39.00)
LDL Cholesterol: 102 mg/dL — ABNORMAL HIGH (ref 0–99)
NonHDL: 109.58
Total CHOL/HDL Ratio: 3
Triglycerides: 38 mg/dL (ref 0.0–149.0)
VLDL: 7.6 mg/dL (ref 0.0–40.0)

## 2022-03-23 LAB — TSH: TSH: 2.43 u[IU]/mL (ref 0.35–5.50)

## 2022-03-23 LAB — BASIC METABOLIC PANEL
BUN: 11 mg/dL (ref 6–23)
CO2: 29 mEq/L (ref 19–32)
Calcium: 9.1 mg/dL (ref 8.4–10.5)
Chloride: 100 mEq/L (ref 96–112)
Creatinine, Ser: 0.85 mg/dL (ref 0.40–1.20)
GFR: 76.74 mL/min (ref >60.00)
Glucose, Bld: 78 mg/dL (ref 70–99)
Potassium: 4.1 mEq/L (ref 3.5–5.1)
Sodium: 139 mEq/L (ref 135–145)

## 2022-03-23 LAB — CBC WITH DIFFERENTIAL/PLATELET
Basophils Absolute: 0 10*3/uL (ref 0.0–0.1)
Basophils Relative: 1.3 % (ref 0.0–3.0)
Eosinophils Absolute: 0.1 10*3/uL (ref 0.0–0.7)
Eosinophils Relative: 3.8 % (ref 0.0–5.0)
HCT: 40.2 % (ref 36.0–46.0)
Hemoglobin: 13.2 g/dL (ref 12.0–15.0)
Lymphocytes Relative: 31.7 % (ref 12.0–46.0)
Lymphs Abs: 0.9 10*3/uL (ref 0.7–4.0)
MCHC: 32.8 g/dL (ref 30.0–36.0)
MCV: 89.7 fl (ref 78.0–100.0)
Monocytes Absolute: 0.3 10*3/uL (ref 0.1–1.0)
Monocytes Relative: 11.5 % (ref 3.0–12.0)
Neutro Abs: 1.5 10*3/uL (ref 1.4–7.7)
Neutrophils Relative %: 51.7 % (ref 43.0–77.0)
Platelets: 203 10*3/uL (ref 150.0–400.0)
RBC: 4.49 Mil/uL (ref 3.87–5.11)
RDW: 13.2 % (ref 11.5–15.5)
WBC: 2.9 10*3/uL — ABNORMAL LOW (ref 4.0–10.5)

## 2022-03-23 LAB — HEPATIC FUNCTION PANEL
ALT: 17 U/L (ref 0–35)
AST: 19 U/L (ref 0–37)
Albumin: 4.2 g/dL (ref 3.5–5.2)
Alkaline Phosphatase: 36 U/L — ABNORMAL LOW (ref 39–117)
Bilirubin, Direct: 0.1 mg/dL (ref 0.0–0.3)
Total Bilirubin: 0.6 mg/dL (ref 0.2–1.2)
Total Protein: 6.6 g/dL (ref 6.0–8.3)

## 2022-03-23 LAB — HEMOGLOBIN A1C: Hgb A1c MFr Bld: 5.6 % (ref 4.6–6.5)

## 2022-03-23 LAB — VITAMIN D 25 HYDROXY (VIT D DEFICIENCY, FRACTURES): VITD: 87.58 ng/mL (ref 30.00–100.00)

## 2022-03-23 LAB — T4, FREE: Free T4: 1.15 ng/dL (ref 0.60–1.60)

## 2022-03-23 LAB — T3, FREE: T3, Free: 2.7 pg/mL (ref 2.3–4.2)

## 2022-03-23 LAB — VITAMIN B12: Vitamin B-12: 674 pg/mL (ref 211–911)

## 2022-04-05 ENCOUNTER — Other Ambulatory Visit: Payer: Self-pay | Admitting: Family Medicine

## 2022-04-05 DIAGNOSIS — E039 Hypothyroidism, unspecified: Secondary | ICD-10-CM

## 2022-04-27 DIAGNOSIS — L578 Other skin changes due to chronic exposure to nonionizing radiation: Secondary | ICD-10-CM | POA: Diagnosis not present

## 2022-04-27 DIAGNOSIS — D2261 Melanocytic nevi of right upper limb, including shoulder: Secondary | ICD-10-CM | POA: Diagnosis not present

## 2022-04-27 DIAGNOSIS — L821 Other seborrheic keratosis: Secondary | ICD-10-CM | POA: Diagnosis not present

## 2022-04-27 DIAGNOSIS — D225 Melanocytic nevi of trunk: Secondary | ICD-10-CM | POA: Diagnosis not present

## 2022-05-05 IMAGING — CR DG RIBS 2V*R*
2 series · 2 of 2 positions shown · non-contrast
Comparison: Chest radiographs also obtained today

CLINICAL DATA: Right-sided rib and chest wall pain for 3 months.

EXAM:
RIGHT RIBS - 2 VIEW

[w ribs ap upper right]
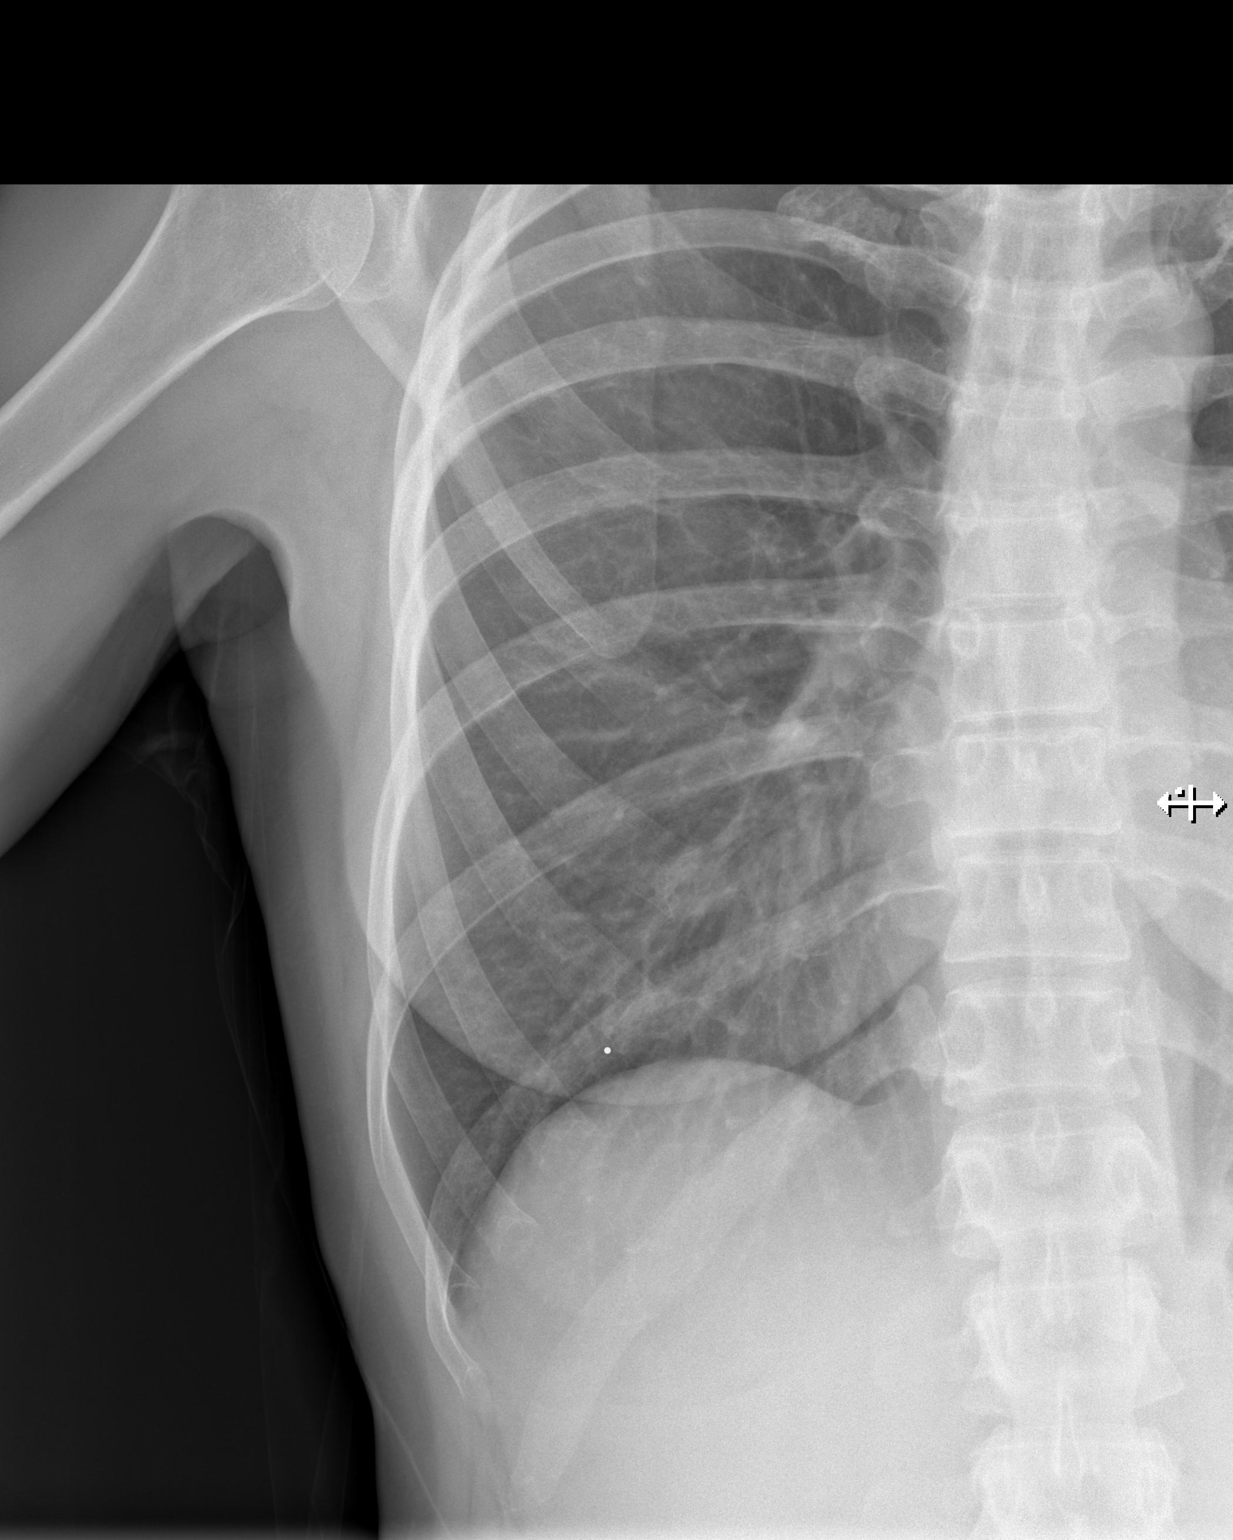

[w ribs obl right]
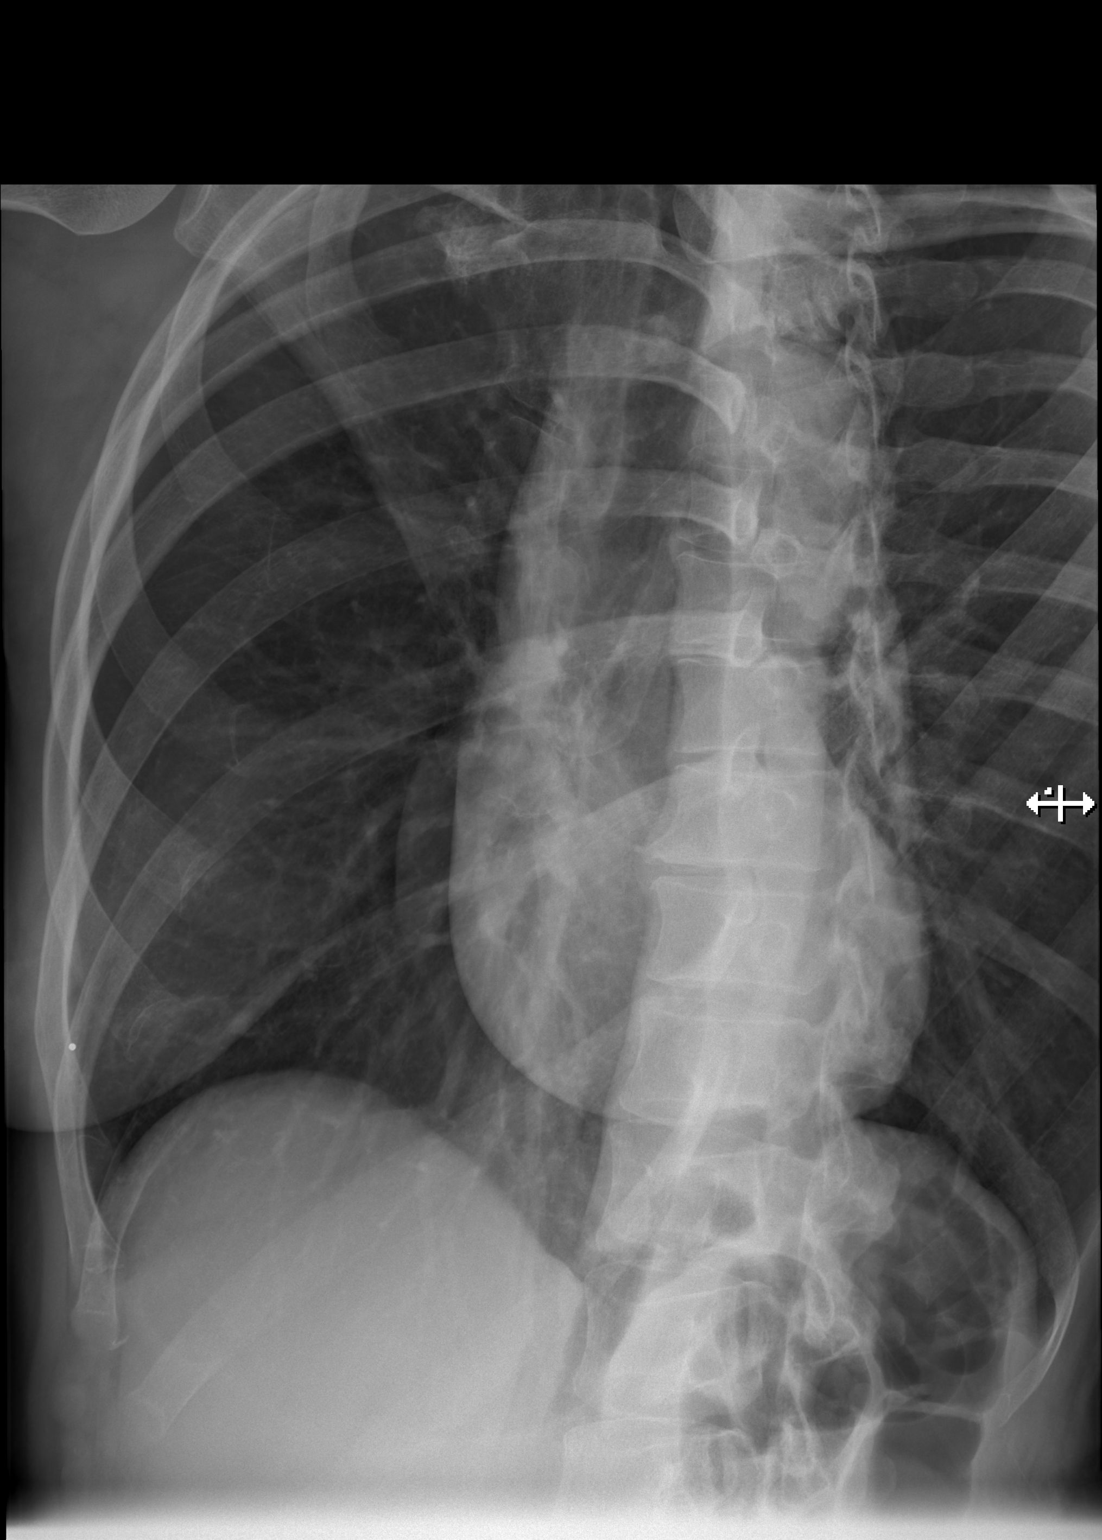

[2 of 2 positions shown; findings below may reference images not displayed]

FINDINGS: No fracture or other bone lesions are seen involving the ribs.
IMPRESSION: Negative.

## 2022-05-18 DIAGNOSIS — M7700 Medial epicondylitis, unspecified elbow: Secondary | ICD-10-CM | POA: Diagnosis not present

## 2022-05-18 DIAGNOSIS — M9901 Segmental and somatic dysfunction of cervical region: Secondary | ICD-10-CM | POA: Diagnosis not present

## 2022-05-18 DIAGNOSIS — M9903 Segmental and somatic dysfunction of lumbar region: Secondary | ICD-10-CM | POA: Diagnosis not present

## 2022-05-18 DIAGNOSIS — M9902 Segmental and somatic dysfunction of thoracic region: Secondary | ICD-10-CM | POA: Diagnosis not present

## 2022-05-22 ENCOUNTER — Ambulatory Visit (INDEPENDENT_AMBULATORY_CARE_PROVIDER_SITE_OTHER): Payer: BC Managed Care – PPO | Admitting: Physician Assistant

## 2022-05-22 ENCOUNTER — Encounter: Payer: Self-pay | Admitting: Physician Assistant

## 2022-05-22 ENCOUNTER — Other Ambulatory Visit: Payer: Self-pay

## 2022-05-22 DIAGNOSIS — M7701 Medial epicondylitis, right elbow: Secondary | ICD-10-CM | POA: Diagnosis not present

## 2022-05-22 DIAGNOSIS — M25521 Pain in right elbow: Secondary | ICD-10-CM

## 2022-05-22 NOTE — Progress Notes (Signed)
HPI: Patricia Moyer comes in today complaining of right elbow pain.  She has been seen in the past for golfers elbow and is given an injection.  She states the injection lasted for about 3 weeks.  She is wondering what can be done next in regards to the elbow pain.  Pain is constant.  She has had no new injury.  Review of systems: See HPI otherwise negative  Physical exam: General: No acute distress.  Mood and affect appropriate Right elbow: Good range of motion.  No abnormal warmth erythema.  Tenderness over the medial epicondyle region.  Provocative maneuvers cause pain medial aspect of the elbow.  Impression: Right golfers elbow  Plan: Offered her formal therapy she defers.  She wants imaging to be performed to make sure we have the correct diagnosis.  Discussed with her other forms of treatment including lifting techniques, massage therapy and formal physical therapy.  She will try the lifting techniques.  She is not interested in formal therapy.  She will check around for massage therapy as they can perform deep tissue massage over the area.  She is unable to take NSAIDs due to colitis.  Therefore we will obtain an MRI of her right elbow to evaluate for medial epicondylitis.  Have her follow-up with Dr. Ninfa Linden after the study to go over results and discuss further treatment.

## 2022-06-08 DIAGNOSIS — A048 Other specified bacterial intestinal infections: Secondary | ICD-10-CM | POA: Diagnosis not present

## 2022-06-09 DIAGNOSIS — M9902 Segmental and somatic dysfunction of thoracic region: Secondary | ICD-10-CM | POA: Diagnosis not present

## 2022-06-09 DIAGNOSIS — M9901 Segmental and somatic dysfunction of cervical region: Secondary | ICD-10-CM | POA: Diagnosis not present

## 2022-06-09 DIAGNOSIS — M7700 Medial epicondylitis, unspecified elbow: Secondary | ICD-10-CM | POA: Diagnosis not present

## 2022-06-09 DIAGNOSIS — M9903 Segmental and somatic dysfunction of lumbar region: Secondary | ICD-10-CM | POA: Diagnosis not present

## 2022-06-12 ENCOUNTER — Ambulatory Visit
Admission: RE | Admit: 2022-06-12 | Discharge: 2022-06-12 | Disposition: A | Discharge status: Home / Self Care | Payer: BC Managed Care – PPO | Source: Ambulatory Visit | Attending: Physician Assistant | Admitting: Physician Assistant

## 2022-06-12 DIAGNOSIS — M25521 Pain in right elbow: Secondary | ICD-10-CM

## 2022-06-12 DIAGNOSIS — S56211A Strain of other flexor muscle, fascia and tendon at forearm level, right arm, initial encounter: Secondary | ICD-10-CM | POA: Diagnosis not present

## 2022-06-21 ENCOUNTER — Ambulatory Visit: Payer: BC Managed Care – PPO | Admitting: Orthopaedic Surgery

## 2022-06-22 ENCOUNTER — Ambulatory Visit (INDEPENDENT_AMBULATORY_CARE_PROVIDER_SITE_OTHER): Payer: BC Managed Care – PPO | Admitting: Orthopaedic Surgery

## 2022-06-22 ENCOUNTER — Other Ambulatory Visit (HOSPITAL_BASED_OUTPATIENT_CLINIC_OR_DEPARTMENT_OTHER): Payer: Self-pay

## 2022-06-22 DIAGNOSIS — M7701 Medial epicondylitis, right elbow: Secondary | ICD-10-CM

## 2022-06-22 MED ORDER — OXYCODONE HCL 5 MG PO TABS
5.0000 mg | ORAL_TABLET | ORAL | 0 refills | Status: AC | PRN
  Filled 2022-06-22: qty 20, 4d supply, fill #0

## 2022-06-22 MED ORDER — IBUPROFEN 800 MG PO TABS
800.0000 mg | ORAL_TABLET | Freq: Three times a day (TID) | ORAL | 0 refills | Status: AC
Start: 2022-06-22 — End: 2022-07-02
  Filled 2022-06-22: qty 30, 10d supply, fill #0

## 2022-06-22 MED ORDER — ACETAMINOPHEN 500 MG PO TABS
500.0000 mg | ORAL_TABLET | Freq: Three times a day (TID) | ORAL | 0 refills | Status: AC
Start: 2022-06-22 — End: 2022-07-02
  Filled 2022-06-22: qty 30, 10d supply, fill #0

## 2022-06-22 NOTE — H&P (View-Only) (Signed)
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  She did not have any inciting trauma or injury.  She was previously diagnosed with medial epicondylitis at which time an injection was performed.  This got her approximately 3 weeks of relief.  She has previously done stretching without any relief.  She is here today for further assessment.  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  This bothers her during a normal day.   Surgical History:   None  PMH/PSH/Family History/Social History/Meds/Allergies:    Past Medical History:  Diagnosis Date   Allergy    Chronic diarrhea    sees Dr. Jamey Reas at [facility]   GERD (gastroesophageal reflux disease)    Lymphocytic colitis    sees Dr. Jamey Reas   Skin cancer 2005   in situ melanoma back, excision   Tarsal tunnel syndrome of right side    sees Dr. Herbie Drape    Thyroid disease    hypothyroidism   Past Surgical History:  Procedure Laterality Date   COLONOSCOPY  05/21/2020   per Dr. Oren Beckmann at [facility], lymphocytic colitis, no polyps   ESOPHAGOGASTRODUODENOSCOPY  05/21/2020   per Dr. Oren Beckmann at [facility], GERD only   SEPTOPLASTY     per Dr. Melissa Montane    TONSILLECTOMY     TYMPANOPLASTY     Social History   Socioeconomic History   Marital status: Married    Spouse name: Not on file   Number of children: Not on file   Years of education: Not on file   Highest education level: Not on file  Occupational History   Not on file  Tobacco Use   Smoking status: Former   Smokeless tobacco: Never  Substance and Sexual Activity   Alcohol use: Yes    Alcohol/week: 4.0 standard drinks of alcohol    Types: 4 Standard drinks or equivalent per week    Comment: occ   Drug use: No   Sexual activity:  Not on file    Comment: works as an Forensic psychologist  Other Topics Concern   Not on file  Social History Narrative   Not on file   Social Determinants of Health   Financial Resource Strain: Not on file  Food Insecurity: Not on file  Transportation Needs: Not on file  Physical Activity: Not on file  Stress: Not on file  Social Connections: Not on file   Family History  Problem Relation Age of Onset   Diabetes Maternal Grandmother    Breast cancer Maternal Grandmother        paternal grandmother   Breast cancer Paternal Grandmother    Colon cancer Neg Hx    Colon polyps Neg Hx    Rectal cancer Neg Hx    Stomach cancer Neg Hx    Allergies  Allergen Reactions   Other Rash   Neomycin-Bacitracin Zn-Polymyx    Vagisil [Anti-Itch Vaginal]    Pramoxine Rash   Current Outpatient Medications  Medication Sig Dispense Refill   acetaminophen (TYLENOL) 500 MG tablet Take 1 tablet (500 mg total) by mouth every  8 (eight) hours for 10 days. 30 tablet 0   ibuprofen (ADVIL) 800 MG tablet Take 1 tablet (800 mg total) by mouth every 8 (eight) hours for 10 days. Please take with food, please alternate with acetaminophen 30 tablet 0   oxyCODONE (OXY IR/ROXICODONE) 5 MG immediate release tablet Take 1 tablet (5 mg total) by mouth every 4 (four) hours as needed (severe pain). 20 tablet 0   budesonide (ENTOCORT EC) 3 MG 24 hr capsule Take 9 mg by mouth daily.     cetirizine (ZYRTEC) 10 MG tablet Take 10 mg by mouth daily.     levothyroxine (SYNTHROID) 75 MCG tablet Take 1 tablet (75 mcg total) by mouth daily. 90 tablet 3   pantoprazole (PROTONIX) 40 MG tablet Take 1 tablet (40 mg total) by mouth 2 (two) times daily before a meal. 60 tablet 2   scopolamine (TRANSDERM SCOP, 1.5 MG,) 1 MG/3DAYS Place 1 patch (1.5 mg total) onto the skin every 3 (three) days. 10 patch 5   triazolam (HALCION) 0.25 MG tablet Take 1 or 2 tablets as needed for sleep 60 tablet 0   No current facility-administered medications for  this visit.   No results found.  Review of Systems:   A ROS was performed including pertinent positives and negatives as documented in the HPI.  Physical Exam :   Constitutional: NAD and appears stated age Neurological: Alert and oriented Psych: Appropriate affect and cooperative There were no vitals taken for this visit.   Comprehensive Musculoskeletal Exam:    Inspection Right Left  Skin No atrophy or gross abnormalities appreciated No atrophy or gross abnormalities appreciated  Palpation    Tenderness Medial epicondyle None  Crepitus None None  Range of Motion    Flexion (passive) 160 160  Flexion (active) 160 160  Extension 0 0  Pronation normal normal  Supination normal normal   Strength    Flexion  5/5 5/5  Extension 5/5 5/5  Pronation  5/5 5/5  Supination  5/5 5/5  Special Tests    Lateral epicondyle pain with resisted wrist extension Negative Negative  Medial epicondyle pain with resisted wrist flexion  Positive Negative  Tinel test over cubital tunnel  Negative  Negative   Instability    Generalized Laxity No No  Varus stress No instability No instability  Valgus stress  No instability No instability  Reflexes    Triceps Normal (2/4) Normal (2/4)  Brachioradialis Normal (2/4) Normal (2/4)  Biceps Normal (2/4) Normal (2/4)  Neurologic    Fires PIN, radial, median, ulnar, musculocutaneous, axillary, suprascapular, long thoracic, and spinal accessory innervated muscles. No abnormal sensibility.  Vascular/Lymphatic    Radial Pulse 2+ 2+  Cervical Exam    Patient has symmetric cervical range of motion with Negative Spurling's test.     Imaging:   Xray (3 views right elbow): Normal  MRI (right elbow): Tendinitis and inflammation with a small avulsion of the medial epicondylar tissue  I personally reviewed and interpreted the radiographs.   Assessment:   56 y.o. female right-hand-dominant presents with medial epicondylitis which is now failed  conservative management.  Given the fact that she has trialed multiple forms of therapy including anti-inflammatories, injection, stretching I do believe that surgical intervention for debridement and repair would allow her to ultimately get longer-term pain relief.  I did discuss specific complications associated with this.  I did discuss that care be taken to protect the ulnar nerve.  She does not have any ulnar nerve  symptoms or neuropathy suggestive of this.  After discussion she has elected for medial epicondylar debridement and repair  Plan :    -Plan for right elbow medial epicondylar debridement and repair   After a lengthy discussion of treatment options, including risks, benefits, alternatives, complications of surgical and nonsurgical conservative options, the patient elected surgical repair.   The patient  is aware of the material risks  and complications including, but not limited to injury to adjacent structures, neurovascular injury, infection, numbness, bleeding, implant failure, thermal burns, stiffness, persistent pain, failure to heal, disease transmission from allograft, need for further surgery, dislocation, anesthetic risks, blood clots, risks of death,and others. The probabilities of surgical success and failure discussed with patient given their particular co-morbidities.The time and nature of expected rehabilitation and recovery was discussed.The patient's questions were all answered preoperatively.  No barriers to understanding were noted. I explained the natural history of the disease process and Rx rationale.  I explained to the patient what I considered to be reasonable expectations given their personal situation.  The final treatment plan was arrived at through a shared patient decision making process model.      I personally saw and evaluated the patient, and participated in the management and treatment plan.  Vanetta Mulders, MD Attending Physician, Orthopedic  Surgery  This document was dictated using Dragon voice recognition software. A reasonable attempt at proof reading has been made to minimize errors.

## 2022-06-22 NOTE — Progress Notes (Signed)
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  She did not have any inciting trauma or injury.  She was previously diagnosed with medial epicondylitis at which time an injection was performed.  This got her approximately 3 weeks of relief.  She has previously done stretching without any relief.  She is here today for further assessment.  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  This bothers her during a normal day.   Surgical History:   None  PMH/PSH/Family History/Social History/Meds/Allergies:    Past Medical History:  Diagnosis Date   Allergy    Chronic diarrhea    sees Dr. Jamey Reas at [facility]   GERD (gastroesophageal reflux disease)    Lymphocytic colitis    sees Dr. Jamey Reas   Skin cancer 2005   in situ melanoma back, excision   Tarsal tunnel syndrome of right side    sees Dr. Herbie Drape    Thyroid disease    hypothyroidism   Past Surgical History:  Procedure Laterality Date   COLONOSCOPY  05/21/2020   per Dr. Oren Beckmann at [place], lymphocytic colitis, no polyps   ESOPHAGOGASTRODUODENOSCOPY  05/21/2020   per Dr. Oren Beckmann at [facility], GERD only   SEPTOPLASTY     per Dr. Melissa Montane    TONSILLECTOMY     TYMPANOPLASTY     Social History   Socioeconomic History   Marital status: Married    Spouse name: Not on file   Number of children: Not on file   Years of education: Not on file   Highest education level: Not on file  Occupational History   Not on file  Tobacco Use   Smoking status: Former   Smokeless tobacco: Never  Substance and Sexual Activity   Alcohol use: Yes    Alcohol/week: 4.0 standard drinks of alcohol    Types: 4 Standard drinks or equivalent per week    Comment: occ   Drug use: No   Sexual activity:  Not on file    Comment: works as an Forensic psychologist  Other Topics Concern   Not on file  Social History Narrative   Not on file   Social Determinants of Health   Financial Resource Strain: Not on file  Food Insecurity: Not on file  Transportation Needs: Not on file  Physical Activity: Not on file  Stress: Not on file  Social Connections: Not on file   Family History  Problem Relation Age of Onset   Diabetes Maternal Grandmother    Breast cancer Maternal Grandmother        paternal grandmother   Breast cancer Paternal Grandmother    Colon cancer Neg Hx    Colon polyps Neg Hx    Rectal cancer Neg Hx    Stomach cancer Neg Hx    Allergies  Allergen Reactions   Other Rash   Neomycin-Bacitracin Zn-Polymyx    Vagisil [Anti-Itch Vaginal]    Pramoxine Rash   Current Outpatient Medications  Medication Sig Dispense Refill   acetaminophen (TYLENOL) 500 MG tablet Take 1 tablet (500 mg total) by mouth every  8 (eight) hours for 10 days. 30 tablet 0   ibuprofen (ADVIL) 800 MG tablet Take 1 tablet (800 mg total) by mouth every 8 (eight) hours for 10 days. Please take with food, please alternate with acetaminophen 30 tablet 0   oxyCODONE (OXY IR/ROXICODONE) 5 MG immediate release tablet Take 1 tablet (5 mg total) by mouth every 4 (four) hours as needed (severe pain). 20 tablet 0   budesonide (ENTOCORT EC) 3 MG 24 hr capsule Take 9 mg by mouth daily.     cetirizine (ZYRTEC) 10 MG tablet Take 10 mg by mouth daily.     levothyroxine (SYNTHROID) 75 MCG tablet Take 1 tablet (75 mcg total) by mouth daily. 90 tablet 3   pantoprazole (PROTONIX) 40 MG tablet Take 1 tablet (40 mg total) by mouth 2 (two) times daily before a meal. 60 tablet 2   scopolamine (TRANSDERM SCOP, 1.5 MG,) 1 MG/3DAYS Place 1 patch (1.5 mg total) onto the skin every 3 (three) days. 10 patch 5   triazolam (HALCION) 0.25 MG tablet Take 1 or 2 tablets as needed for sleep 60 tablet 0   No current facility-administered medications for  this visit.   No results found.  Review of Systems:   A ROS was performed including pertinent positives and negatives as documented in the HPI.  Physical Exam :   Constitutional: NAD and appears stated age Neurological: Alert and oriented Psych: Appropriate affect and cooperative There were no vitals taken for this visit.   Comprehensive Musculoskeletal Exam:    Inspection Right Left  Skin No atrophy or gross abnormalities appreciated No atrophy or gross abnormalities appreciated  Palpation    Tenderness Medial epicondyle None  Crepitus None None  Range of Motion    Flexion (passive) 160 160  Flexion (active) 160 160  Extension 0 0  Pronation normal normal  Supination normal normal   Strength    Flexion  5/5 5/5  Extension 5/5 5/5  Pronation  5/5 5/5  Supination  5/5 5/5  Special Tests    Lateral epicondyle pain with resisted wrist extension Negative Negative  Medial epicondyle pain with resisted wrist flexion  Positive Negative  Tinel test over cubital tunnel  Negative  Negative   Instability    Generalized Laxity No No  Varus stress No instability No instability  Valgus stress  No instability No instability  Reflexes    Triceps Normal (2/4) Normal (2/4)  Brachioradialis Normal (2/4) Normal (2/4)  Biceps Normal (2/4) Normal (2/4)  Neurologic    Fires PIN, radial, median, ulnar, musculocutaneous, axillary, suprascapular, long thoracic, and spinal accessory innervated muscles. No abnormal sensibility.  Vascular/Lymphatic    Radial Pulse 2+ 2+  Cervical Exam    Patient has symmetric cervical range of motion with Negative Spurling's test.     Imaging:   Xray (3 views right elbow): Normal  MRI (right elbow): Tendinitis and inflammation with a small avulsion of the medial epicondylar tissue  I personally reviewed and interpreted the radiographs.   Assessment:   56 y.o. female right-hand-dominant presents with medial epicondylitis which is now failed  conservative management.  Given the fact that she has trialed multiple forms of therapy including anti-inflammatories, injection, stretching I do believe that surgical intervention for debridement and repair would allow her to ultimately get longer-term pain relief.  I did discuss specific complications associated with this.  I did discuss that care be taken to protect the ulnar nerve.  She does not have any ulnar nerve  symptoms or neuropathy suggestive of this.  After discussion she has elected for medial epicondylar debridement and repair  Plan :    -Plan for right elbow medial epicondylar debridement and repair   After a lengthy discussion of treatment options, including risks, benefits, alternatives, complications of surgical and nonsurgical conservative options, the patient elected surgical repair.   The patient  is aware of the material risks  and complications including, but not limited to injury to adjacent structures, neurovascular injury, infection, numbness, bleeding, implant failure, thermal burns, stiffness, persistent pain, failure to heal, disease transmission from allograft, need for further surgery, dislocation, anesthetic risks, blood clots, risks of death,and others. The probabilities of surgical success and failure discussed with patient given their particular co-morbidities.The time and nature of expected rehabilitation and recovery was discussed.The patient's questions were all answered preoperatively.  No barriers to understanding were noted. I explained the natural history of the disease process and Rx rationale.  I explained to the patient what I considered to be reasonable expectations given their personal situation.  The final treatment plan was arrived at through a shared patient decision making process model.      I personally saw and evaluated the patient, and participated in the management and treatment plan.  Vanetta Mulders, MD Attending Physician, Orthopedic  Surgery  This document was dictated using Dragon voice recognition software. A reasonable attempt at proof reading has been made to minimize errors.

## 2022-06-27 ENCOUNTER — Encounter (HOSPITAL_BASED_OUTPATIENT_CLINIC_OR_DEPARTMENT_OTHER): Payer: Self-pay | Admitting: Orthopaedic Surgery

## 2022-06-27 NOTE — Telephone Encounter (Signed)
Clearance was received from GP 06/27/22. I spoke with patient, and scheduled surgery for 07/11/22.

## 2022-06-29 ENCOUNTER — Ambulatory Visit (HOSPITAL_BASED_OUTPATIENT_CLINIC_OR_DEPARTMENT_OTHER): Payer: Self-pay | Admitting: Orthopaedic Surgery

## 2022-06-29 DIAGNOSIS — M7701 Medial epicondylitis, right elbow: Secondary | ICD-10-CM

## 2022-06-29 NOTE — Pre-Procedure Instructions (Signed)
Surgical Instructions    Your procedure is scheduled on Tuesday, July 11, 2022 at 3:57 PM.  Report to [facility] Entrance "A" at 2:00 PM., then check in with the Admitting office.  Call this number if you have problems the morning of surgery:  [phone]   If you have any questions prior to your surgery date call [phone]: Open Monday-Friday 8am-4pm  *If you experience any cold or flu symptoms such as cough, fever, chills, shortness of breath, etc. between now and your scheduled surgery, please notify us.*    Remember:  Do not eat after midnight the night before your surgery  You may drink clear liquids until 1:00 PM the morning of your surgery.   Clear liquids allowed are: Water, Non-Citrus Juices (without pulp), Carbonated Beverages, Clear Tea, Black Coffee Only (NO MILK, CREAM OR POWDERED CREAMER of any kind), and Gatorade.    Take these medicines the morning of surgery with A SIP OF WATER:  cetirizine (ZYRTEC)  levothyroxine (SYNTHROID)  phenylephrine (SUDAFED PE)   IF NEEDED: acetaminophen (TYLENOL)  oxyCODONE (OXY IR/ROXICODONE)  tetrahydrozoline-zinc (VISINE-AC)   As of today, STOP taking any Aspirin (unless otherwise instructed by your surgeon) Aleve, Naproxen, Ibuprofen, Motrin, Advil, Goody's, BC's, all herbal medications, fish oil, and all vitamins.                     Do NOT Smoke (Tobacco/Vaping) for 24 hours prior to your procedure.  If you use a CPAP at night, you may bring your mask/headgear for your overnight stay.   Contacts, glasses, piercing's, hearing aid's, dentures or partials may not be worn into surgery, please bring cases for these belongings.    For patients admitted to the hospital, discharge time will be determined by your treatment team.   Patients discharged the day of surgery will not be allowed to drive home, and someone needs to stay with them for 24 hours.  SURGICAL WAITING ROOM VISITATION Patients having surgery or a  procedure may have two support people in the waiting area. Visitors may stay in the waiting area during the procedure and switch out with other visitors if needed. Children under the age of 36 must have an adult accompany them who is not the patient. If the patient needs to stay at the hospital during part of their recovery, the visitor guidelines for inpatient rooms apply.  Please refer to the [facility] website for the visitor guidelines for Inpatients (after your surgery is over and you are in a regular room).    Special instructions:   [place]- Preparing For Surgery  Before surgery, you can play an important role. Because skin is not sterile, your skin needs to be as free of germs as possible. You can reduce the number of germs on your skin by washing with CHG (chlorahexidine gluconate) Soap before surgery.  CHG is an antiseptic cleaner which kills germs and bonds with the skin to continue killing germs even after washing.    Oral Hygiene is also important to reduce your risk of infection.  Remember - BRUSH YOUR TEETH THE MORNING OF SURGERY WITH YOUR REGULAR TOOTHPASTE  Please do not use if you have an allergy to CHG or antibacterial soaps. If your skin becomes reddened/irritated stop using the CHG.  Do not shave (including legs and underarms) for at least 48 hours prior to first CHG shower. It is OK to shave your face.  Please follow these instructions carefully.   Shower the [employer]  BEFORE SURGERY and the MORNING OF SURGERY  If you chose to wash your hair, wash your hair first as usual with your normal shampoo.  After you shampoo, rinse your hair and body thoroughly to remove the shampoo.  Use CHG Soap as you would any other liquid soap. You can apply CHG directly to the skin and wash gently with a scrungie or a clean washcloth.   Apply the CHG Soap to your body ONLY FROM THE NECK DOWN.  Do not use on open wounds or open sores. Avoid contact with your eyes, ears, mouth and  genitals (private parts). Wash Face and genitals (private parts)  with your normal soap.   Wash thoroughly, paying special attention to the area where your surgery will be performed.  Thoroughly rinse your body with warm water from the neck down.  DO NOT shower/wash with your normal soap after using and rinsing off the CHG Soap.  Pat yourself dry with a CLEAN TOWEL.  Wear CLEAN PAJAMAS to bed the night before surgery  Place CLEAN SHEETS on your bed the night before your surgery  DO NOT SLEEP WITH PETS.   Day of Surgery: Take a shower with CHG soap. Do not wear jewelry or makeup Do not wear lotions, powders, perfumes/colognes, or deodorant. Do not shave 48 hours prior to surgery. Do not bring valuables to the hospital.  [facility] is not responsible for any belongings or valuables. Do not wear nail polish, gel polish, artificial nails, or any other type of covering on natural nails (fingers and toes) If you have artificial nails or gel coating that need to be removed by a nail salon, please have this removed prior to surgery. Artificial nails or gel coating may interfere with anesthesia's ability to adequately monitor your vital signs. Wear Clean/Comfortable clothing the morning of surgery Do not apply any deodorants/lotions.   Remember to brush your teeth WITH YOUR REGULAR TOOTHPASTE.   Please read over the following fact sheets that you were given.  If you received a COVID test during your pre-op visit  it is requested that you wear a mask when out in public, stay away from anyone that may not be feeling well and notify your surgeon if you develop symptoms. If you have been in contact with anyone that has tested positive in the last 10 days please notify you surgeon.

## 2022-06-30 ENCOUNTER — Other Ambulatory Visit: Payer: Self-pay

## 2022-06-30 ENCOUNTER — Encounter (HOSPITAL_COMMUNITY)
Admission: RE | Admit: 2022-06-30 | Discharge: 2022-06-30 | Disposition: A | Discharge status: Home / Self Care | Payer: BC Managed Care – PPO | Source: Ambulatory Visit | Attending: Orthopaedic Surgery | Admitting: Orthopaedic Surgery

## 2022-06-30 ENCOUNTER — Encounter (HOSPITAL_COMMUNITY): Payer: Self-pay

## 2022-06-30 VITALS — BP 113/92 | HR 73 | Temp 97.5°F | Resp 16 | Ht 69.0 in | Wt 162.1 lb

## 2022-06-30 DIAGNOSIS — M7700 Medial epicondylitis, unspecified elbow: Secondary | ICD-10-CM | POA: Diagnosis not present

## 2022-06-30 DIAGNOSIS — M9903 Segmental and somatic dysfunction of lumbar region: Secondary | ICD-10-CM | POA: Diagnosis not present

## 2022-06-30 DIAGNOSIS — M9902 Segmental and somatic dysfunction of thoracic region: Secondary | ICD-10-CM | POA: Diagnosis not present

## 2022-06-30 DIAGNOSIS — M9901 Segmental and somatic dysfunction of cervical region: Secondary | ICD-10-CM | POA: Diagnosis not present

## 2022-06-30 DIAGNOSIS — D72819 Decreased white blood cell count, unspecified: Secondary | ICD-10-CM

## 2022-06-30 DIAGNOSIS — Z01812 Encounter for preprocedural laboratory examination: Secondary | ICD-10-CM | POA: Diagnosis not present

## 2022-06-30 DIAGNOSIS — Z01818 Encounter for other preprocedural examination: Secondary | ICD-10-CM

## 2022-06-30 HISTORY — DX: Hypothyroidism, unspecified: E03.9

## 2022-06-30 LAB — BASIC METABOLIC PANEL
Anion gap: 10 (ref 5–15)
BUN: 11 mg/dL (ref 6–20)
CO2: 25 mmol/L (ref 22–32)
Calcium: 9.4 mg/dL (ref 8.9–10.3)
Chloride: 103 mmol/L (ref 98–111)
Creatinine, Ser: 0.77 mg/dL (ref 0.44–1.00)
GFR, Estimated: >60 mL/min (ref >60)
Glucose, Bld: 80 mg/dL (ref 70–99)
Potassium: 4 mmol/L (ref 3.5–5.1)
Sodium: 138 mmol/L (ref 135–145)

## 2022-06-30 LAB — CBC
HCT: 38.5 % (ref 36.0–46.0)
Hemoglobin: 13.1 g/dL (ref 12.0–15.0)
MCH: 29.8 pg (ref 26.0–34.0)
MCHC: 34 g/dL (ref 30.0–36.0)
MCV: 87.5 fL (ref 80.0–100.0)
Platelets: 231 10*3/uL (ref 150–400)
RBC: 4.4 MIL/uL (ref 3.87–5.11)
RDW: 13.3 % (ref 11.5–15.5)
WBC: 3.1 10*3/uL — ABNORMAL LOW (ref 4.0–10.5)
nRBC: 0 % (ref 0.0–0.2)

## 2022-06-30 NOTE — Pre-Procedure Instructions (Signed)
Surgical Instructions    Your procedure is scheduled on Tuesday, July 11, 2022   Report to [facility] Entrance "A" at 2:00 PM., then check in with the Admitting office.  Call this number if you have problems the morning of surgery:  [phone]   If you have any questions prior to your surgery date call [phone]: Open Monday-Friday 8am-4pm  *If you experience any cold or flu symptoms such as cough, fever, chills, shortness of breath, etc. between now and your scheduled surgery, please notify us.*    Remember:  Do not eat after midnight the night before your surgery  You may drink clear liquids until 1:00 PM the morning of your surgery.   Clear liquids allowed are: Water, Non-Citrus Juices (without pulp), Carbonated Beverages, Clear Tea, Black Coffee Only (NO MILK, CREAM OR POWDERED CREAMER of any kind), and Gatorade.  Patient Instructions  The night before surgery:  No food after midnight. ONLY clear liquids after midnight  The day of surgery (if you do NOT have diabetes):  Drink ONE (1) Pre-Surgery Clear Ensure by 1:00pm the morning of surgery. Drink in one sitting. Do not sip.  This drink was given to you during your hospital  pre-op appointment visit. Nothing else to drink after completing the  Pre-Surgery Clear Ensure.          If you have questions, please contact your surgeon's office.     Take these medicines the morning of surgery with A SIP OF WATER: cetirizine (ZYRTEC)  levothyroxine (SYNTHROID)   IF NEEDED: acetaminophen (TYLENOL)  oxyCODONE (OXY IR/ROXICODONE)  tetrahydrozoline-zinc (VISINE-AC)   As of today, STOP taking any Aspirin (unless otherwise instructed by your surgeon) Aleve, Naproxen, Ibuprofen, Motrin, Advil, Goody's, BC's, all herbal medications, fish oil, and all vitamins.                     Do NOT Smoke (Tobacco/Vaping) for 24 hours prior to your procedure.  If you use a CPAP at night, you may bring your mask/headgear for  your overnight stay.   Contacts, glasses, piercing's, hearing aid's, dentures or partials may not be worn into surgery, please bring cases for these belongings.    For patients admitted to the hospital, discharge time will be determined by your treatment team.   Patients discharged the day of surgery will not be allowed to drive home, and someone needs to stay with them for 24 hours.  SURGICAL WAITING ROOM VISITATION Patients having surgery or a procedure may have two support people in the waiting area. Visitors may stay in the waiting area during the procedure and switch out with other visitors if needed. Children under the age of 63 must have an adult accompany them who is not the patient. If the patient needs to stay at the hospital during part of their recovery, the visitor guidelines for inpatient rooms apply.  Please refer to the [facility] website for the visitor guidelines for Inpatients (after your surgery is over and you are in a regular room).    Special instructions:   [place]- Preparing For Surgery  Before surgery, you can play an important role. Because skin is not sterile, your skin needs to be as free of germs as possible. You can reduce the number of germs on your skin by washing with CHG (chlorahexidine gluconate) Soap before surgery.  CHG is an antiseptic cleaner which kills germs and bonds with the skin to continue killing germs even after washing.  Oral Hygiene is also important to reduce your risk of infection.  Remember - BRUSH YOUR TEETH THE MORNING OF SURGERY WITH YOUR REGULAR TOOTHPASTE  Please do not use if you have an allergy to CHG or antibacterial soaps. If your skin becomes reddened/irritated stop using the CHG.  Do not shave (including legs and underarms) for at least 48 hours prior to first CHG shower. It is OK to shave your face.  Please follow these instructions carefully.   Shower the NIGHT BEFORE SURGERY and the MORNING OF SURGERY  If you  chose to wash your hair, wash your hair first as usual with your normal shampoo.  After you shampoo, rinse your hair and body thoroughly to remove the shampoo.  Use CHG Soap as you would any other liquid soap. You can apply CHG directly to the skin and wash gently with a scrungie or a clean washcloth.   Apply the CHG Soap to your body ONLY FROM THE NECK DOWN.  Do not use on open wounds or open sores. Avoid contact with your eyes, ears, mouth and genitals (private parts). Wash Face and genitals (private parts)  with your normal soap.   Wash thoroughly, paying special attention to the area where your surgery will be performed.  Thoroughly rinse your body with warm water from the neck down.  DO NOT shower/wash with your normal soap after using and rinsing off the CHG Soap.  Pat yourself dry with a CLEAN TOWEL.  Wear CLEAN PAJAMAS to bed the night before surgery  Place CLEAN SHEETS on your bed the night before your surgery  DO NOT SLEEP WITH PETS.   Day of Surgery: Take a shower with CHG soap. Do not wear jewelry or makeup Do not wear lotions, powders, perfumes/colognes, or deodorant. Do not shave 48 hours prior to surgery. Do not bring valuables to the hospital.  [facility] is not responsible for any belongings or valuables. Do not wear nail polish, gel polish, artificial nails, or any other type of covering on natural nails (fingers and toes) If you have artificial nails or gel coating that need to be removed by a nail salon, please have this removed prior to surgery. Artificial nails or gel coating may interfere with anesthesia's ability to adequately monitor your vital signs. Wear Clean/Comfortable clothing the morning of surgery Do not apply any deodorants/lotions.   Remember to brush your teeth WITH YOUR REGULAR TOOTHPASTE.   Please read over the following fact sheets that you were given.  If you received a COVID test during your pre-op visit  it is requested that you wear a  mask when out in public, stay away from anyone that may not be feeling well and notify your surgeon if you develop symptoms. If you have been in contact with anyone that has tested positive in the last 10 days please notify you surgeon.

## 2022-06-30 NOTE — Progress Notes (Signed)
PCP - Alysia Penna Cardiologist - Shelva Majestic- seen once for swelling in legs- only needs to come back if needed  PPM/ICD - denies   Chest x-ray -n/a  EKG - n/a Stress Test - denies ECHO - 03/01/21 Cardiac Cath - denies  Sleep Study - denies   As of today, STOP taking any Aspirin (unless otherwise instructed by your surgeon) Aleve, Naproxen, Ibuprofen, Motrin, Advil, Goody's, BC's, all herbal medications, fish oil, and all vitamins. :  ERAS Protcol -yes PRE-SURGERY Ensure or G2- ensure ordered and given  COVID TEST- not needed   Anesthesia review: no  Patient denies shortness of breath, fever, cough and chest pain at PAT appointment   All instructions explained to the patient, with a verbal understanding of the material. Patient agrees to go over the instructions while at home for a better understanding. Patient also instructed to self quarantine after being tested for COVID-19. The opportunity to ask questions was provided.

## 2022-07-04 DIAGNOSIS — Z8619 Personal history of other infectious and parasitic diseases: Secondary | ICD-10-CM | POA: Diagnosis not present

## 2022-07-04 DIAGNOSIS — R1011 Right upper quadrant pain: Secondary | ICD-10-CM | POA: Diagnosis not present

## 2022-07-11 ENCOUNTER — Ambulatory Visit (HOSPITAL_COMMUNITY): Payer: BC Managed Care – PPO | Admitting: Anesthesiology

## 2022-07-11 ENCOUNTER — Encounter (HOSPITAL_COMMUNITY): Payer: Self-pay | Admitting: Orthopaedic Surgery

## 2022-07-11 ENCOUNTER — Ambulatory Visit (HOSPITAL_COMMUNITY)
Admission: RE | Admit: 2022-07-11 | Discharge: 2022-07-11 | Disposition: A | Discharge status: Home / Self Care | Payer: BC Managed Care – PPO | Source: Ambulatory Visit | Attending: Orthopaedic Surgery | Admitting: Orthopaedic Surgery

## 2022-07-11 ENCOUNTER — Encounter (HOSPITAL_COMMUNITY)
Admission: RE | Disposition: A | Discharge status: Home / Self Care | Payer: Self-pay | Source: Ambulatory Visit | Attending: Orthopaedic Surgery

## 2022-07-11 DIAGNOSIS — E039 Hypothyroidism, unspecified: Secondary | ICD-10-CM | POA: Diagnosis not present

## 2022-07-11 DIAGNOSIS — M7701 Medial epicondylitis, right elbow: Secondary | ICD-10-CM | POA: Diagnosis not present

## 2022-07-11 DIAGNOSIS — Z87891 Personal history of nicotine dependence: Secondary | ICD-10-CM | POA: Diagnosis not present

## 2022-07-11 DIAGNOSIS — Z01818 Encounter for other preprocedural examination: Secondary | ICD-10-CM

## 2022-07-11 DIAGNOSIS — K219 Gastro-esophageal reflux disease without esophagitis: Secondary | ICD-10-CM | POA: Diagnosis not present

## 2022-07-11 HISTORY — PX: ELBOW ARTHROSCOPY: SHX614

## 2022-07-11 LAB — POCT PREGNANCY, URINE: Preg Test, Ur: NEGATIVE

## 2022-07-11 SURGERY — ARTHROSCOPY, ELBOW
Anesthesia: Monitor Anesthesia Care | Site: Elbow | Laterality: Right

## 2022-07-11 MED ORDER — GABAPENTIN 300 MG PO CAPS
300.0000 mg | ORAL_CAPSULE | Freq: Once | ORAL | Status: AC
  Administered 2022-07-11: 300 mg via ORAL
  Filled 2022-07-11: qty 1

## 2022-07-11 MED ORDER — PROPOFOL 500 MG/50ML IV EMUL
INTRAVENOUS | Status: DC | PRN
  Administered 2022-07-11: 125 ug/kg/min via INTRAVENOUS

## 2022-07-11 MED ORDER — CEFAZOLIN SODIUM-DEXTROSE 2-4 GM/100ML-% IV SOLN
2.0000 g | INTRAVENOUS | Status: AC
  Administered 2022-07-11: 2 g via INTRAVENOUS
  Filled 2022-07-11: qty 100

## 2022-07-11 MED ORDER — HYDROMORPHONE HCL 1 MG/ML IJ SOLN: 0.2500 mg | INTRAMUSCULAR | Status: DC | PRN

## 2022-07-11 MED ORDER — ACETAMINOPHEN 500 MG PO TABS
1000.0000 mg | ORAL_TABLET | Freq: Once | ORAL | Status: AC
  Administered 2022-07-11: 1000 mg via ORAL
  Filled 2022-07-11: qty 2

## 2022-07-11 MED ORDER — MIDAZOLAM HCL 2 MG/2ML IJ SOLN
INTRAMUSCULAR | Status: AC
  Filled 2022-07-11: qty 2

## 2022-07-11 MED ORDER — CHLORHEXIDINE GLUCONATE 0.12 % MT SOLN
15.0000 mL | Freq: Once | OROMUCOSAL | Status: AC
  Administered 2022-07-11: 15 mL via OROMUCOSAL
  Filled 2022-07-11: qty 15

## 2022-07-11 MED ORDER — PROMETHAZINE HCL 25 MG/ML IJ SOLN: 6.2500 mg | INTRAMUSCULAR | Status: DC | PRN

## 2022-07-11 MED ORDER — MIDAZOLAM HCL 2 MG/2ML IJ SOLN
2.0000 mg | Freq: Once | INTRAMUSCULAR | Status: AC
  Administered 2022-07-11: 2 mg via INTRAVENOUS

## 2022-07-11 MED ORDER — ROPIVACAINE HCL 5 MG/ML IJ SOLN
INTRAMUSCULAR | Status: DC | PRN
  Administered 2022-07-11: 30 mL via PERINEURAL

## 2022-07-11 MED ORDER — FENTANYL CITRATE (PF) 100 MCG/2ML IJ SOLN
100.0000 ug | Freq: Once | INTRAMUSCULAR | Status: AC
  Administered 2022-07-11: 100 ug via INTRAVENOUS

## 2022-07-11 MED ORDER — FENTANYL CITRATE (PF) 100 MCG/2ML IJ SOLN
INTRAMUSCULAR | Status: AC
  Filled 2022-07-11: qty 2

## 2022-07-11 MED ORDER — TRANEXAMIC ACID-NACL 1000-0.7 MG/100ML-% IV SOLN
1000.0000 mg | INTRAVENOUS | Status: AC
  Administered 2022-07-11: 1000 mg via INTRAVENOUS
  Filled 2022-07-11: qty 100

## 2022-07-11 MED ORDER — ORAL CARE MOUTH RINSE: 15.0000 mL | Freq: Once | OROMUCOSAL | Status: AC

## 2022-07-11 MED ORDER — SODIUM CHLORIDE 0.9 % IR SOLN
Status: DC | PRN
  Administered 2022-07-11: 1000 mL

## 2022-07-11 MED ORDER — OXYCODONE HCL 5 MG/5ML PO SOLN: 5.0000 mg | Freq: Once | ORAL | Status: DC | PRN

## 2022-07-11 MED ORDER — OXYCODONE HCL 5 MG PO TABS: 5.0000 mg | ORAL_TABLET | Freq: Once | ORAL | Status: DC | PRN

## 2022-07-11 MED ORDER — LACTATED RINGERS IV SOLN: INTRAVENOUS | Status: DC

## 2022-07-11 SURGICAL SUPPLY — 33 items
ANCH SUT 2 NDL DX FBRTK (Anchor) ×1 IMPLANT
ANCHOR KNOTLESS SUT DX #2 (Anchor) IMPLANT
BAG COUNTER SPONGE SURGICOUNT (BAG) ×2 IMPLANT
BAG SPNG CNTER NS LX DISP (BAG) ×1
BNDG CMPR STD VLCR NS LF 5.8X4 (GAUZE/BANDAGES/DRESSINGS) ×1
BNDG ELASTIC 4X5.8 VLCR NS LF (GAUZE/BANDAGES/DRESSINGS) IMPLANT
COVER SURGICAL LIGHT HANDLE (MISCELLANEOUS) ×2 IMPLANT
DRAPE U-SHAPE 47X51 STRL (DRAPES) ×4 IMPLANT
GAUZE SPONGE 4X4 12PLY STRL (GAUZE/BANDAGES/DRESSINGS) IMPLANT
GAUZE XEROFORM 1X8 LF (GAUZE/BANDAGES/DRESSINGS) IMPLANT
GLOVE BIOGEL PI IND STRL 6.5 (GLOVE) ×2 IMPLANT
GLOVE BIOGEL PI IND STRL 8 (GLOVE) ×2 IMPLANT
GLOVE ECLIPSE 6.0 STRL STRAW (GLOVE) ×2 IMPLANT
GLOVE INDICATOR 8.0 STRL GRN (GLOVE) ×2 IMPLANT
GOWN STRL NON-REIN LRG LVL3 (GOWN DISPOSABLE) ×2 IMPLANT
GOWN STRL REUS W/ TWL LRG LVL3 (GOWN DISPOSABLE) ×2 IMPLANT
GOWN STRL REUS W/TWL LRG LVL3 (GOWN DISPOSABLE) ×1
KIT BASIN OR (CUSTOM PROCEDURE TRAY) ×2 IMPLANT
KIT FIBERTAK DX KNTLS DISP (KITS) IMPLANT
KIT TURNOVER KIT B (KITS) ×2 IMPLANT
MANIFOLD NEPTUNE II (INSTRUMENTS) ×2 IMPLANT
PACK ARTHROSCOPY DSU (CUSTOM PROCEDURE TRAY) ×2 IMPLANT
PAD ARMBOARD 7.5X6 YLW CONV (MISCELLANEOUS) ×4 IMPLANT
PADDING CAST SYNTHETIC 4X4 STR (CAST SUPPLIES) IMPLANT
SLING ARM IMMOBILIZER LRG (SOFTGOODS) IMPLANT
SUT ETHILON 3 0 PS 1 (SUTURE) ×2 IMPLANT
SUT VIC AB 2-0 CT1 27 (SUTURE) ×1
SUT VIC AB 2-0 CT1 TAPERPNT 27 (SUTURE) IMPLANT
TOWEL GREEN STERILE (TOWEL DISPOSABLE) ×2 IMPLANT
TOWEL GREEN STERILE FF (TOWEL DISPOSABLE) ×2 IMPLANT
TUBING ARTHROSCOPY IRRIG 16FT (MISCELLANEOUS) ×2 IMPLANT
UNDERPAD 30X36 HEAVY ABSORB (UNDERPADS AND DIAPERS) ×2 IMPLANT
WATER STERILE IRR 1000ML POUR (IV SOLUTION) ×2 IMPLANT

## 2022-07-11 NOTE — Anesthesia Preprocedure Evaluation (Signed)
Anesthesia Evaluation  Patient identified by MRN, date of birth, ID band Patient awake    Reviewed: Allergy & Precautions, H&P , NPO status , Patient's Chart, lab work & pertinent test results  Airway Mallampati: II  TM Distance: >3 FB Neck ROM: Full    Dental no notable dental hx.    Pulmonary neg pulmonary ROS, former smoker   Pulmonary exam normal breath sounds clear to auscultation       Cardiovascular negative cardio ROS Normal cardiovascular exam Rhythm:Regular Rate:Normal     Neuro/Psych negative neurological ROS  negative psych ROS   GI/Hepatic Neg liver ROS,GERD  ,,  Endo/Other  Hypothyroidism    Renal/GU negative Renal ROS  negative genitourinary   Musculoskeletal negative musculoskeletal ROS (+)    Abdominal   Peds negative pediatric ROS (+)  Hematology negative hematology ROS (+)   Anesthesia Other Findings   Reproductive/Obstetrics negative OB ROS                             Anesthesia Physical Anesthesia Plan  ASA: 2  Anesthesia Plan: MAC and Regional   Post-op Pain Management: Tylenol PO (pre-op)* and Gabapentin PO (pre-op)*   Induction: Intravenous  PONV Risk Score and Plan: 2 and Ondansetron, Midazolam and Treatment may vary due to age or medical condition  Airway Management Planned: Simple Face Mask  Additional Equipment:   Intra-op Plan:   Post-operative Plan:   Informed Consent: I have reviewed the patients History and Physical, chart, labs and discussed the procedure including the risks, benefits and alternatives for the proposed anesthesia with the patient or authorized representative who has indicated his/her understanding and acceptance.     Dental advisory given  Plan Discussed with: CRNA  Anesthesia Plan Comments:        Anesthesia Quick Evaluation

## 2022-07-11 NOTE — Anesthesia Procedure Notes (Signed)
Anesthesia Regional Block: Supraclavicular block   Pre-Anesthetic Checklist: , timeout performed,  Correct Patient, Correct Site, Correct Laterality,  Correct Procedure, Correct Position, site marked,  Risks and benefits discussed,  Surgical consent,  Pre-op evaluation,  At surgeon's request and post-op pain management  Laterality: Right  Prep: chloraprep       Needles:  Injection technique: Single-shot  Needle Type: Stimiplex     Needle Length: 9cm  Needle Gauge: 21     Additional Needles:   Narrative:  Start time: 07/11/2022 1:16 PM End time: 07/11/2022 1:21 PM Injection made incrementally with aspirations every 5 mL.  Performed by: Personally  Anesthesiologist: Lynda Rainwater, MD

## 2022-07-11 NOTE — Anesthesia Postprocedure Evaluation (Signed)
Anesthesia Post Note  Patient: Transport planner  Procedure(s) Performed: RIGHT MEDIAL EPICONDYLE DEBRIDEMENT AND REPAIR (Right: Elbow)     Patient location during evaluation: PACU Anesthesia Type: Regional Level of consciousness: awake and alert Pain management: pain level controlled Vital Signs Assessment: post-procedure vital signs reviewed and stable Respiratory status: spontaneous breathing, nonlabored ventilation and respiratory function stable Cardiovascular status: blood pressure returned to baseline and stable Postop Assessment: no apparent nausea or vomiting Anesthetic complications: no   No notable events documented.  Last Vitals:  Vitals:   07/11/22 1430 07/11/22 1445  BP: (!) 131/95 (!) 128/95  Pulse: (!) 57 (!) 53  Resp: (!) 9 13  Temp:  (!) 36.4 C  SpO2: 99% 99%    Last Pain:  Vitals:   07/11/22 1445  TempSrc:   PainSc: 0-No pain                 Lynda Rainwater

## 2022-07-11 NOTE — Brief Op Note (Signed)
   Brief Op Note  Date of Surgery: 07/11/2022  Preoperative Diagnosis: RIGHT MEDIAL EPICONDYLITIS  Postoperative Diagnosis: same  Procedure: Procedure(s): RIGHT MEDIAL EPICONDYLE DEBRIDEMENT AND REPAIR  Implants: Implant Name Type Inv. Item Serial No. Manufacturer Lot No. LRB No. Used Action  ANCHOR KNOTLESS SUT DX #2 - [id number] Anchor ANCHOR KNOTLESS SUT DX [employer] [id number] Right 1 Implanted    Surgeons: Surgeon(s): Vanetta Mulders, MD  Anesthesia: Monitor Anesthesia Care    Estimated Blood Loss: See anesthesia record  Complications: None  Condition to PACU: Stable  Yevonne Pax, MD 07/11/2022 2:11 PM

## 2022-07-11 NOTE — Discharge Instructions (Signed)
     Discharge Instructions    Attending Surgeon: Vanetta Mulders, MD Office Phone Number: [phone]   Diagnosis and Procedures:    Surgeries Performed: Right medial epicondyle debridement and repair  Discharge Plan:    Diet: Resume usual diet. Begin with light or bland foods.  Drink plenty of fluids.  Activity:  Keep sling and dressing in place until your follow up visit in Physical Therapy You are advised to go home directly from the hospital or surgical center. Restrict your activities.  GENERAL INSTRUCTIONS: 1.  Keep your surgical site elevated above your heart for at least 5-7 days or longer to prevent swelling. This will improve your comfort and your overall recovery following surgery.     2. Please call Dr. Eddie Dibbles office at [phone] with questions Monday-Friday during business hours. If no one answers, please leave a message and someone should get back to the patient within 24 hours. For emergencies please call 911 or proceed to the emergency room.   3. Patient to notify surgical team if experiences any of the following: Bowel/Bladder dysfunction, uncontrolled pain, nerve/muscle weakness, incision with increased drainage or redness, nausea/vomiting and Fever greater than 101.0 F.  Be alert for signs of infection including redness, streaking, odor, fever or chills. Be alert for excessive pain or bleeding and notify your surgeon immediately.  WOUND INSTRUCTIONS:   Leave your dressing/cast/splint in place until your post operative visit.  Keep it clean and dry.  Always keep the incision clean and dry until the staples/sutures are removed. If there is no drainage from the incision you should keep it open to air. If there is drainage from the incision you must keep it covered at all times until the drainage stops  Do not soak in a bath tub, hot tub, pool, lake or other body of water until 21 days after your surgery and your incision is completely dry and healed.  If  you have removable sutures (or staples) they must be removed 10-14 days (unless otherwise instructed) from the day of your surgery.     1)  Elevate the extremity as much as possible.  2)  Keep the dressing clean and dry.  3)  Please call us if the dressing becomes wet or dirty.  4)  If you are experiencing worsening pain or worsening swelling, please call.     MEDICATIONS: Resume all previous home medications at the previous prescribed dose and frequency unless otherwise noted Start taking the  pain medications on an as-needed basis as prescribed  Please taper down pain medication over the next week following surgery.  Ideally you should not require a refill of any narcotic pain medication.  Take pain medication with food to minimize nausea. In addition to the prescribed pain medication, you may take over-the-counter pain relievers such as Tylenol.  Do NOT take additional tylenol if your pain medication already has tylenol in it.  Aspirin '325mg'$  daily for four weeks.      FOLLOWUP INSTRUCTIONS: 1. Follow up at the Physical Therapy Clinic 3-4 days following surgery. This appointment should be scheduled unless other arrangements have been made.The Physical Therapy scheduling number is [phone] if an appointment has not already been arranged.  2. Contact Dr. Eddie Dibbles office during office hours at [phone] or the practice after hours line at [phone] for non-emergencies. For medical emergencies call 911.   Discharge Location: Home

## 2022-07-11 NOTE — Transfer of Care (Signed)
Immediate Anesthesia Transfer of Care Note  Patient: Elizabet Witman  Procedure(s) Performed: RIGHT MEDIAL EPICONDYLE DEBRIDEMENT AND REPAIR (Right: Elbow)  Patient Location: PACU  Anesthesia Type:MAC and Regional  Level of Consciousness: awake and patient cooperative  Airway & Oxygen Therapy: Patient Spontanous Breathing and Patient connected to face mask oxygen  Post-op Assessment: Report given to RN, Post -op Vital signs reviewed and stable, and Patient moving all extremities  Post vital signs: Reviewed and stable  Last Vitals:  Vitals Value Taken Time  BP    Temp    Pulse 60 07/11/22 1414  Resp    SpO2 100 % 07/11/22 1414  Vitals shown include unvalidated device data.  Last Pain:  Vitals:   07/11/22 1234  TempSrc:   PainSc: 3       Patients Stated Pain Goal: 0 (16/07/37 1062)  Complications: No notable events documented.

## 2022-07-11 NOTE — Interval H&P Note (Signed)
History and Physical Interval Note:  07/11/2022 1:04 PM  Patricia Moyer  has presented today for surgery, with the diagnosis of [place].  The various methods of treatment have been discussed with the patient and family. After consideration of risks, benefits and other options for treatment, the patient has consented to  Procedure(s): [place] (Right) as a surgical intervention.  The patient's history has been reviewed, patient examined, no change in status, stable for surgery.  I have reviewed the patient's chart and labs.  Questions were answered to the patient's satisfaction.     Vanetta Mulders

## 2022-07-11 NOTE — Anesthesia Procedure Notes (Signed)
Procedure Name: MAC Date/Time: 07/11/2022 1:42 PM  Performed by: Elvin So, CRNAPre-anesthesia Checklist: Patient identified, Emergency Drugs available, Suction available and Patient being monitored Patient Re-evaluated:Patient Re-evaluated prior to induction Oxygen Delivery Method: Simple face mask Dental Injury: Teeth and Oropharynx as per pre-operative assessment

## 2022-07-11 NOTE — Op Note (Signed)
   Date of Surgery: 07/11/2022  INDICATIONS: Ms. Bula is a 56 y.o.-year-old female with right medial epicondylitis which has failed conservative managment.  The risk and benefits of the procedure were discussed in detail and documented in the pre-operative evaluation.   PREOPERATIVE DIAGNOSIS: 1. Right medial epicondylitisr  POSTOPERATIVE DIAGNOSIS: Same.  PROCEDURE: 1. Right medial epicondyle debridement and repair  SURGEON: Yevonne Pax MD  ASSISTANT: Raynelle Fanning, ATC  ANESTHESIA:  Interscalene nerve block plus MAC  IV FLUIDS AND URINE: See anesthesia record.  ANTIBIOTICS: Ancef  ESTIMATED BLOOD LOSS: 10 mL.  IMPLANTS:  Implant Name Type Inv. Item Serial No. Manufacturer Lot No. LRB No. Used Action  ANCHOR KNOTLESS SUT DX #2 - [id number] Anchor ANCHOR KNOTLESS SUT DX #2  [place] [id number] Right 1 Implanted    DRAINS: None  CULTURES: None  COMPLICATIONS: none  DESCRIPTION OF PROCEDURE:  The patient was identified in the preoperative holding area.  The correct site was marked according universal protocol with nursing.  Antibiotics were given 1 hour prior to skin incision.    The patient was subsequently taken back to the operating room. They were moved to the operating room table.  All bony prominences were padded. The patient was prepped and draped in usual sterile fashion.  Again final timeout was performed confirming the correct surgical site.  An incision was made curvilinear about the medial epicondyle.  At this time layer by layer dissection was used with Metzenbaum scissors.  Electrocautery was used to achieve hemostasis.  At this time the medial epicondle origin was identified and released from bone using 15 blade.  Ronguer was used to debride the epicondyle angiofibrodysplastic tissue.  At this time a knotless distal extremity fiber tack was placed into the bony epicondyle.  The suture was passed through the tendon origin in a horizontal mattres fashion and  this was fed into the knotless mechanism.  This was tightened with good apposition to the native origin footprint.  At this time the wound was thoroughly irrigated and closed in layers of 2-0 Vicryl and 3-0 nylon.  Dressings were placed with Xeroform gauze cast padding Webril and an Ace wrap.  A sling was provided.     POSTOPERATIVE PLAN: She will be weight bearing as tolerated on the right arm. I will see her back in 2 weeks for suture removal. She will be placed on tylenol and ibuprofen for pain control.  Yevonne Pax, MD 2:12 PM

## 2022-07-12 ENCOUNTER — Encounter (HOSPITAL_COMMUNITY): Payer: Self-pay | Admitting: Orthopaedic Surgery

## 2022-07-17 DIAGNOSIS — K828 Other specified diseases of gallbladder: Secondary | ICD-10-CM | POA: Diagnosis not present

## 2022-07-17 DIAGNOSIS — R932 Abnormal findings on diagnostic imaging of liver and biliary tract: Secondary | ICD-10-CM | POA: Diagnosis not present

## 2022-07-26 ENCOUNTER — Ambulatory Visit (INDEPENDENT_AMBULATORY_CARE_PROVIDER_SITE_OTHER): Payer: BC Managed Care – PPO | Admitting: Orthopaedic Surgery

## 2022-07-26 DIAGNOSIS — M7701 Medial epicondylitis, right elbow: Secondary | ICD-10-CM

## 2022-07-26 NOTE — Progress Notes (Signed)
Post Operative Evaluation    Procedure/Date of Surgery: Right medial epicondyle repair 11/28  Interval History:    Presents today 2 weeks status post the above procedure.  Overall she is doing very well.  Just endorses some tightness with flexion of the elbow.  Denies any paresthesias or loss of sensation in the right hand.     PMH/PSH/Family History/Social History/Meds/Allergies:    Past Medical History:  Diagnosis Date   Allergy    Chronic diarrhea    sees Dr. Jamey Reas at [facility]   GERD (gastroesophageal reflux disease)    Hypothyroidism    Lymphocytic colitis    sees Dr. Jamey Reas   Skin cancer 2005   in situ melanoma back, excision   Tarsal tunnel syndrome of right side    sees Dr. Herbie Drape    Thyroid disease    hypothyroidism   Past Surgical History:  Procedure Laterality Date   COLONOSCOPY  05/21/2020   per Dr. Oren Beckmann at [place], lymphocytic colitis, no polyps   ELBOW ARTHROSCOPY Right 07/11/2022   Procedure: RIGHT MEDIAL EPICONDYLE [place];  Surgeon: Vanetta Mulders, MD;  Location: [place];  Service: Orthopedics;  Laterality: Right;   ESOPHAGOGASTRODUODENOSCOPY  05/21/2020   per Dr. Oren Beckmann at [place], GERD only   photoreactive keratectomy     SEPTOPLASTY     per Dr. Melissa Montane    TONSILLECTOMY     TYMPANOPLASTY     Social History   Socioeconomic History   Marital status: Married    Spouse name: Not on file   Number of children: Not on file   Years of education: Not on file   Highest education level: Not on file  Occupational History   Not on file  Tobacco Use   Smoking status: Former   Smokeless tobacco: Never  Vaping Use   Vaping Use: Never used  Substance and Sexual Activity   Alcohol use: Yes    Alcohol/week: 4.0 standard drinks of alcohol    Types: 4 Standard drinks or equivalent per week    Comment: occ   Drug use: No   Sexual activity: Not on file    Comment: works as an Forensic psychologist   Other Topics Concern   Not on file  Social History Narrative   Not on file   Social Determinants of Health   Financial Resource Strain: Not on file  Food Insecurity: Not on file  Transportation Needs: Not on file  Physical Activity: Not on file  Stress: Not on file  Social Connections: Not on file   Family History  Problem Relation Age of Onset   Diabetes Maternal Grandmother    Breast cancer Maternal Grandmother        paternal grandmother   Breast cancer Paternal Grandmother    Colon cancer Neg Hx    Colon polyps Neg Hx    Rectal cancer Neg Hx    Stomach cancer Neg Hx    Allergies  Allergen Reactions   Neomycin-Bacitracin Zn-Polymyx Dermatitis    Neosporin - causes skin irritation    Vagisil [Anti-Itch Vaginal]     Skin Irritation    Current Outpatient Medications  Medication Sig Dispense Refill   B Complex Vitamins (B COMPLEX PO) Take 1 tablet by mouth daily.     Calcium Carbonate-Vit D-Min (  CALCIUM/VITAMIN D/MINERALS PO) Take 2 tablets by mouth daily.     cetirizine (ZYRTEC) 10 MG tablet Take 10 mg by mouth daily.     Cholecalciferol (VITAMIN D3) 125 MCG (5000 UT) CAPS Take 5,000 Units by mouth daily.     Digestive Enzymes (DIGESTIVE ENZYME ULTRA PO) Take 2 capsules by mouth every morning.     levothyroxine (SYNTHROID) 75 MCG tablet Take 1 tablet (75 mcg total) by mouth daily. 90 tablet 3   Misc Natural Products (GLUCOSAMINE CHOND MSM FORMULA PO) Take 30 mLs by mouth daily.     oxyCODONE (OXY IR/ROXICODONE) 5 MG immediate release tablet Take 1 tablet (5 mg total) by mouth every 4 (four) hours as needed (severe pain). 20 tablet 0   phenylephrine (SUDAFED PE) 10 MG TABS tablet Take 10 mg by mouth every morning.     Probiotic Product (PROBIOTIC DAILY PO) Take 1 capsule by mouth daily.     scopolamine (TRANSDERM SCOP, 1.5 MG,) 1 MG/3DAYS Place 1 patch (1.5 mg total) onto the skin every 3 (three) days. (Patient taking differently: Place 1 patch onto the skin every 3  (three) days. AS NEEDED FOR CRUISES) 10 patch 5   tetrahydrozoline-zinc (VISINE-AC) 0.05-0.25 % ophthalmic solution Place 2 drops into both eyes as needed (dry eyes).     triazolam (HALCION) 0.25 MG tablet Take 1 or 2 tablets as needed for sleep (Patient taking differently: Take 0.25-0.5 mg by mouth at bedtime as needed for sleep (For plane trips). Take 1 or 2 tablets as needed for sleep) 60 tablet 0   Turmeric 500 MG CAPS Take 1,000 mg by mouth daily.     No current facility-administered medications for this visit.   No results found.  Review of Systems:   A ROS was performed including pertinent positives and negatives as documented in the HPI.   Musculoskeletal Exam:    There were no vitals taken for this visit.  Right medial elbow incision is well-appearing without erythema or drainage.  Range of motion is from 0 to 125 degrees without pain.  Wound is well-appearing.  2+ radial pulse.  Full pro supination.  Mild swelling about the medial elbow  Imaging:      I personally reviewed and interpreted the radiographs.   Assessment:   56 year old female is 2 weeks status post right medial epicondyle repair overall doing extremely well.  At this time she will continue to advance her range of motion.  I will plan to see her back in 4 weeks for reassessment  Plan :    -Return to clinic in 4 weeks for reassessment      I personally saw and evaluated the patient, and participated in the management and treatment plan.  Vanetta Mulders, MD Attending Physician, Orthopedic Surgery  This document was dictated using Dragon voice recognition software. A reasonable attempt at proof reading has been made to minimize errors.

## 2022-08-04 DIAGNOSIS — M9901 Segmental and somatic dysfunction of cervical region: Secondary | ICD-10-CM | POA: Diagnosis not present

## 2022-08-04 DIAGNOSIS — M7700 Medial epicondylitis, unspecified elbow: Secondary | ICD-10-CM | POA: Diagnosis not present

## 2022-08-04 DIAGNOSIS — M9903 Segmental and somatic dysfunction of lumbar region: Secondary | ICD-10-CM | POA: Diagnosis not present

## 2022-08-04 DIAGNOSIS — M9902 Segmental and somatic dysfunction of thoracic region: Secondary | ICD-10-CM | POA: Diagnosis not present

## 2022-08-11 ENCOUNTER — Other Ambulatory Visit: Payer: Self-pay | Admitting: Obstetrics and Gynecology

## 2022-08-11 DIAGNOSIS — Z803 Family history of malignant neoplasm of breast: Secondary | ICD-10-CM

## 2022-08-23 ENCOUNTER — Ambulatory Visit (INDEPENDENT_AMBULATORY_CARE_PROVIDER_SITE_OTHER): Payer: BC Managed Care – PPO | Admitting: Orthopaedic Surgery

## 2022-08-23 DIAGNOSIS — M7701 Medial epicondylitis, right elbow: Secondary | ICD-10-CM

## 2022-08-23 NOTE — Progress Notes (Signed)
Post Operative Evaluation    Procedure/Date of Surgery: Right medial epicondyle repair 11/28  Interval History:    Presents today's week status post above procedure.  She is having a little bit of swelling around the incision although she continues to improve.  She has no pain at today's visit.  She is working on her stretches that I instructed at last visit.   PMH/PSH/Family History/Social History/Meds/Allergies:    Past Medical History:  Diagnosis Date   Allergy    Chronic diarrhea    sees Dr. Jamey Reas at [facility]   GERD (gastroesophageal reflux disease)    Hypothyroidism    Lymphocytic colitis    sees Dr. Jamey Reas   Skin cancer 2005   in situ melanoma back, excision   Tarsal tunnel syndrome of right side    sees Dr. Herbie Drape    Thyroid disease    hypothyroidism   Past Surgical History:  Procedure Laterality Date   COLONOSCOPY  05/21/2020   per Dr. Oren Beckmann at [place], lymphocytic colitis, no polyps   ELBOW ARTHROSCOPY Right 07/11/2022   Procedure: RIGHT MEDIAL EPICONDYLE [place];  Surgeon: Vanetta Mulders, MD;  Location: [place];  Service: Orthopedics;  Laterality: Right;   ESOPHAGOGASTRODUODENOSCOPY  05/21/2020   per Dr. Oren Beckmann at [place], GERD only   photoreactive keratectomy     SEPTOPLASTY     per Dr. Melissa Montane    TONSILLECTOMY     TYMPANOPLASTY     Social History   Socioeconomic History   Marital status: Married    Spouse name: Not on file   Number of children: Not on file   Years of education: Not on file   Highest education level: Not on file  Occupational History   Not on file  Tobacco Use   Smoking status: Former   Smokeless tobacco: Never  Vaping Use   Vaping Use: Never used  Substance and Sexual Activity   Alcohol use: Yes    Alcohol/week: 4.0 standard drinks of alcohol    Types: 4 Standard drinks or equivalent per week    Comment: occ   Drug use: No   Sexual activity: Not on file     Comment: works as an Forensic psychologist  Other Topics Concern   Not on file  Social History Narrative   Not on file   Social Determinants of Health   Financial Resource Strain: Not on file  Food Insecurity: Not on file  Transportation Needs: Not on file  Physical Activity: Not on file  Stress: Not on file  Social Connections: Not on file   Family History  Problem Relation Age of Onset   Diabetes Maternal Grandmother    Breast cancer Maternal Grandmother        paternal grandmother   Breast cancer Paternal Grandmother    Colon cancer Neg Hx    Colon polyps Neg Hx    Rectal cancer Neg Hx    Stomach cancer Neg Hx    Allergies  Allergen Reactions   Neomycin-Bacitracin Zn-Polymyx Dermatitis    Neosporin - causes skin irritation    Vagisil [Anti-Itch Vaginal]     Skin Irritation    Current Outpatient Medications  Medication Sig Dispense Refill   B Complex Vitamins (B COMPLEX PO) Take 1 tablet by mouth daily.  Calcium Carbonate-Vit D-Min (CALCIUM/VITAMIN D/MINERALS PO) Take 2 tablets by mouth daily.     cetirizine (ZYRTEC) 10 MG tablet Take 10 mg by mouth daily.     Cholecalciferol (VITAMIN D3) 125 MCG (5000 UT) CAPS Take 5,000 Units by mouth daily.     Digestive Enzymes (DIGESTIVE ENZYME ULTRA PO) Take 2 capsules by mouth every morning.     levothyroxine (SYNTHROID) 75 MCG tablet Take 1 tablet (75 mcg total) by mouth daily. 90 tablet 3   Misc Natural Products (GLUCOSAMINE CHOND MSM FORMULA PO) Take 30 mLs by mouth daily.     oxyCODONE (OXY IR/ROXICODONE) 5 MG immediate release tablet Take 1 tablet (5 mg total) by mouth every 4 (four) hours as needed (severe pain). 20 tablet 0   phenylephrine (SUDAFED PE) 10 MG TABS tablet Take 10 mg by mouth every morning.     Probiotic Product (PROBIOTIC DAILY PO) Take 1 capsule by mouth daily.     scopolamine (TRANSDERM SCOP, 1.5 MG,) 1 MG/3DAYS Place 1 patch (1.5 mg total) onto the skin every 3 (three) days. (Patient taking differently: Place 1  patch onto the skin every 3 (three) days. AS NEEDED FOR CRUISES) 10 patch 5   tetrahydrozoline-zinc (VISINE-AC) 0.05-0.25 % ophthalmic solution Place 2 drops into both eyes as needed (dry eyes).     triazolam (HALCION) 0.25 MG tablet Take 1 or 2 tablets as needed for sleep (Patient taking differently: Take 0.25-0.5 mg by mouth at bedtime as needed for sleep (For plane trips). Take 1 or 2 tablets as needed for sleep) 60 tablet 0   Turmeric 500 MG CAPS Take 1,000 mg by mouth daily.     No current facility-administered medications for this visit.   No results found.  Review of Systems:   A ROS was performed including pertinent positives and negatives as documented in the HPI.   Musculoskeletal Exam:    There were no vitals taken for this visit.  Right medial elbow incision is well-appearing without erythema or drainage.  Range of motion is from 0 to 125 degrees without pain.  Wound is well-appearing.  2+ radial pulse.  Full pro supination.  Mild swelling about the medial elbow  Imaging:      I personally reviewed and interpreted the radiographs.   Assessment:   57 year old female is 6-week status post right medial epicondyle debridement. At today's visit she is doing very well.  I would like her to work with occupational therapy in order to sessions to work on stretching and to work on swelling modalities about the elbow.  I will plan to see her back as needed Plan :    -Return to clinic as needed      I personally saw and evaluated the patient, and participated in the management and treatment plan.  Vanetta Mulders, MD Attending Physician, Orthopedic Surgery  This document was dictated using Dragon voice recognition software. A reasonable attempt at proof reading has been made to minimize errors.

## 2022-08-30 NOTE — Therapy (Addendum)
OUTPATIENT OCCUPATIONAL THERAPY ORTHO EVALUATION & DISCHARGE NOTE  Patient Name: Patricia Moyer MRN: [id number] DOB:May 08, 1966, 57 y.o., female Today's Date: 09/12/2022  PCP: Alysia Penna, MD REFERRING PROVIDER:  Vanetta Mulders, MD    END OF SESSION:    Past Medical History:  Diagnosis Date   Allergy    Chronic diarrhea    sees Dr. Jamey Reas at [facility]   GERD (gastroesophageal reflux disease)    Hypothyroidism    Lymphocytic colitis    sees Dr. Jamey Reas   Skin cancer 2005   in situ melanoma back, excision   Tarsal tunnel syndrome of right side    sees Dr. Herbie Drape    Thyroid disease    hypothyroidism   Past Surgical History:  Procedure Laterality Date   COLONOSCOPY  05/21/2020   per Dr. Oren Beckmann at [place], lymphocytic colitis, no polyps   ELBOW ARTHROSCOPY Right 07/11/2022   Procedure: RIGHT MEDIAL EPICONDYLE [place];  Surgeon: Vanetta Mulders, MD;  Location: [place];  Service: Orthopedics;  Laterality: Right;   ESOPHAGOGASTRODUODENOSCOPY  05/21/2020   per Dr. Oren [facility]mann at [facility], GERD only   photoreactive keratectomy     SEPTOPLASTY     per Dr. Melissa Montane    TONSILLECTOMY     TYMPANOPLASTY     Patient Active Problem List   Diagnosis Date Noted   Medial epicondylitis of right elbow 07/11/2022   Insomnia 03/01/2022   Restless legs syndrome 01/29/2020   Hypothyroidism 10/03/2019   Raynaud's disease without gangrene 10/03/2019   Perennial allergic rhinitis 08/18/2019   Acute diffuse otitis externa of right ear 08/18/2019   Chronic leukopenia 02/28/2016   Eczema 01/31/2016   Tarsal tunnel syndrome of right side 03/19/2014   Esophageal reflux 05/30/2012   History of melanoma in situ 04/17/2008   TMJ SYNDROME 04/17/2008    ONSET DATE: DOS: 07/11/22  REFERRING DIAG: M77.01 (ICD-10-CM) - Medial epicondylitis of right elbow   THERAPY DIAG:  Muscle weakness (generalized) - Plan: Ot plan of care cert/re-cert  Pain in right elbow - Plan: Ot  plan of care cert/re-cert  Stiffness of right elbow, not elsewhere classified - Plan: Ot plan of care cert/re-cert  Rationale for Evaluation and Treatment: Rehabilitation  SUBJECTIVE:   SUBJECTIVE STATEMENT: She states 2 years of pain in the right dominant elbow before seeking the surgery. Main pain was at night and still is but much better now.  Other than night pain and problems sleeping, she also states problems lifting and carrying objects, discomfort with some of her work routines and inability to do normal self-care health maintenance exercise routines.   PERTINENT HISTORY: Now 7 weeks post-op Rt medial epicondylitis debridement and repair. Per MD: "R elbow stiffness and ROM following medial epicondyle debridement and repair "  Typically would need no immobilization at this point, AROM and PROM would be allowed as tolerated, by 9 weeks strength and endurance training is recommended. Caution through 12 weeks- watch for ulnar nerve entrapment.   PRECAUTIONS: None; WEIGHT BEARING RESTRICTIONS: No  PAIN:  Are you having pain? No, just tight in elbow sx area  Rating: 0/10 at rest now, up to 3-4/10 at worst in past week in the night or morning    FALLS: Has patient fallen in last 6 months? No  LIVING ENVIRONMENT: Lives with: lives with their family  PLOF: Independent  PATIENT GOALS: To have less pain and discomfort, more functional use of right arm back.   OBJECTIVE: (All objective assessments below are  from initial evaluation on: 08/31/22 unless otherwise specified.)   HAND DOMINANCE: Right   ADLs: Overall ADLs: States decreased ability to grab, hold household objects, pain and inability to open containers, perform FMS tasks (manipulate fasteners on clothing)   FUNCTIONAL OUTCOME MEASURES: Eval: Patient Specific Functional Scale: 4.8 (sleeping, pushup/workout, picking up home object)  (Higher Score  =  Better Ability for the Selected Tasks)       UPPER EXTREMITY ROM      Shoulder to Wrist AROM Right eval  Elbow flexion 147 (compared to 151 Lt side)   Elbow extension Full and even hyperextending ~5*  Forearm supination full  Forearm pronation  full  Wrist flexion 75  Wrist extension 70 (compared to Lt 78*)  (Blank rows = not tested)   Hand AROM Right eval  Full Fist Ability (or Gap to Distal Palmar Crease) Yes  Thumb Opposition  (Kapandji Scale)  10  (Blank rows = not tested)   UPPER EXTREMITY MMT:    Eval:  NT at eval due to still healing sx. Will be tested when appropriate.   MMT Right TBD  Elbow flexion   Elbow extension   Forearm supination   Forearm pronation   Wrist flexion   Wrist extension   Wrist ulnar deviation   Wrist radial deviation   (Blank rows = not tested)  HAND FUNCTION: Eval: Observed weakness in affected Rt hand.  Grip strength Right: 59 lbs, Left: 60 lbs   COORDINATION: Eval: no significant problems or complaints noted today  SENSATION: Eval:  Light touch intact today, no hyper sensation or paresthesia complaints today  EDEMA:   Eval:  Mildly swollen in right medial epicondyle area from postsurgical changes and mild inflammation still.  COGNITION: Eval: Overall cognitive status: WFL for evaluation today   OBSERVATIONS:   Eval: Largely nontender, nonhypersensitive to touch at medial epicondyle scar looks well-healing, motion lacking at end ranges but doing fairly well.   TODAY'S TREATMENT:  Post-evaluation treatment: OT provides education to avoid painful or provocative positions including elbow flexed greater than 90 degrees in the night, lifting with supinated grasp, repetitive postures and activities that stress the common flexor origin.  She states understanding.  OT educates her to use moist heat and self massage for 3 to 5 minutes before doing light exercises and activities as listed below.  She can finish with ice at the end 5 to 10 minutes for soreness if any exist.  She states understanding and  demonstrates these exercises back to OT, including very light eccentric strength and hand strengthening today which causes her no pain.  She is encouraged not to "rush into" these things or do painfully.  She was also recommended to get a compressive arm sleeve to help take the pressure off of her still healing and weak common flexor origin.  Exercises - Ulnar Nerve Flossing  - 3-4 x daily - 5-10 reps - Wrist Prayer Stretch  - 4 x daily - 3-5 reps - 15 sec hold - Standing Wrist Flexion Stretch  - 4-6 x daily - 1 sets - 10-15 reps - Seated Wrist Extension Stretch  - 3-6 x daily - 3-5 reps - 15 hold - Seated Eccentric Wrist Flexion with Dumbbell  - 1-3 x daily - 1-2 sets - 10-15 reps - Full Fist  - 2-3 x daily - 5 reps    PATIENT EDUCATION: Education details: See tx section above for details  Person educated: Patient Education method: Verbal Instruction, Teach back, Handouts  Education comprehension: States and demonstrates understanding, Additional Education required    HOME EXERCISE PROGRAM: Access Code: [email or web address] URL: [email or web address] Date: 08/31/2022 Prepared by: Benito Mccreedy   GOALS: Goals reviewed with patient? Yes   SHORT TERM GOALS: (STG required if POC>30 days) Target Date: 08/31/22  Pt will demo/state understanding of initial HEP to improve pain levels and prerequisite motion. Goal status: MET   LONG TERM GOALS: Target Date: 09/29/22  Pt will improve functional ability by decreased impairment per PSFS assessment from 4.8 to 8 or better, for better quality of life. Goal status: INITIAL  2.  Pt will improve grip strength in Rt hand from 59lbs to at least 65lbs for functional use at home and in IADLs. Goal status: INITIAL  3.  Pt will improve A/ROM in Rt elbow flexion from 147* to at least 150*, to have functional motion for tasks like reach and grasp.  Goal status: INITIAL  4.  Pt will improve strength in Rt wrist flexion to at least 5/5 MMT  to have increased functional ability to carry out selfcare and higher-level homecare tasks with no difficulty. Goal status: INITIAL  5.  Pt will decrease pain at worst from 3-4/10 in the night to 1/10 or better to have better sleep and occupational participation in daily roles. Goal status: INITIAL   ASSESSMENT:  CLINICAL IMPRESSION: Patient is a 57 y.o. female who was seen today for occupational therapy evaluation for right elbow stiffness and soreness also weakness and decreased functional ability.  She will benefit from outpatient occupational therapy to increase quality of life.   PERFORMANCE DEFICITS: in functional skills including ADLs, IADLs, ROM, strength, pain, fascial restrictions, flexibility, Gross motor control, body mechanics, endurance, decreased knowledge of precautions, and UE functional use, cognitive skills including safety awareness, and psychosocial skills including coping strategies and habits.   IMPAIRMENTS: are limiting patient from ADLs, IADLs, rest and sleep, work, and leisure.   COMORBIDITIES: may have co-morbidities  that affects occupational performance. Patient will benefit from skilled OT to address above impairments and improve overall function.  MODIFICATION OR ASSISTANCE TO COMPLETE EVALUATION: No modification of tasks or assist necessary to complete an evaluation.  OT OCCUPATIONAL PROFILE AND HISTORY: Problem focused assessment: Including review of records relating to presenting problem.  CLINICAL DECISION MAKING: LOW - limited treatment options, no task modification necessary  REHAB POTENTIAL: Excellent  EVALUATION COMPLEXITY: Low      PLAN:  OT FREQUENCY: 1x/week  OT DURATION: 4 weeks (through 09/29/22 as needed)   PLANNED INTERVENTIONS: self care/ADL training, therapeutic exercise, therapeutic activity, neuromuscular re-education, manual therapy, scar mobilization, passive range of motion, splinting, fluidotherapy, compression bandaging,  moist heat, cryotherapy, contrast bath, patient/family education, and coping strategies training  RECOMMENDED OTHER SERVICES: none now   CONSULTED AND AGREED WITH PLAN OF CARE: Patient  PLAN FOR NEXT SESSION:  Unless having a pain or problem, she will return in 2 weeks to review initial recommendations and home exercise programs hand and supination stretching and increase strengthening portions to more "typical" strengthening routines and concentrate contractions with weights etc.   Benito Mccreedy, OTR/L, CHT 09/12/2022, 8:09 AM   OCCUPATIONAL THERAPY DISCHARGE SUMMARY  Visits from Start of Care: 0  She cx all appointments after eval stating she felt all better.  Goals could not be addressed.    Benito Mccreedy, OTR/L, CHT 09/12/22

## 2022-08-31 ENCOUNTER — Other Ambulatory Visit: Payer: Self-pay

## 2022-08-31 ENCOUNTER — Ambulatory Visit (INDEPENDENT_AMBULATORY_CARE_PROVIDER_SITE_OTHER): Payer: BC Managed Care – PPO | Admitting: Rehabilitative and Restorative Service Providers"

## 2022-08-31 ENCOUNTER — Encounter: Payer: Self-pay | Admitting: Rehabilitative and Restorative Service Providers"

## 2022-08-31 DIAGNOSIS — M6281 Muscle weakness (generalized): Secondary | ICD-10-CM | POA: Diagnosis not present

## 2022-08-31 DIAGNOSIS — M25621 Stiffness of right elbow, not elsewhere classified: Secondary | ICD-10-CM

## 2022-08-31 DIAGNOSIS — M25521 Pain in right elbow: Secondary | ICD-10-CM

## 2022-09-06 DIAGNOSIS — M9902 Segmental and somatic dysfunction of thoracic region: Secondary | ICD-10-CM | POA: Diagnosis not present

## 2022-09-06 DIAGNOSIS — M9901 Segmental and somatic dysfunction of cervical region: Secondary | ICD-10-CM | POA: Diagnosis not present

## 2022-09-06 DIAGNOSIS — M7700 Medial epicondylitis, unspecified elbow: Secondary | ICD-10-CM | POA: Diagnosis not present

## 2022-09-06 DIAGNOSIS — M9903 Segmental and somatic dysfunction of lumbar region: Secondary | ICD-10-CM | POA: Diagnosis not present

## 2022-09-15 ENCOUNTER — Encounter: Payer: BC Managed Care – PPO | Admitting: Rehabilitative and Restorative Service Providers"

## 2022-10-05 DIAGNOSIS — M9901 Segmental and somatic dysfunction of cervical region: Secondary | ICD-10-CM | POA: Diagnosis not present

## 2022-10-05 DIAGNOSIS — M9902 Segmental and somatic dysfunction of thoracic region: Secondary | ICD-10-CM | POA: Diagnosis not present

## 2022-10-05 DIAGNOSIS — M9903 Segmental and somatic dysfunction of lumbar region: Secondary | ICD-10-CM | POA: Diagnosis not present

## 2022-10-05 DIAGNOSIS — M7700 Medial epicondylitis, unspecified elbow: Secondary | ICD-10-CM | POA: Diagnosis not present

## 2022-10-10 ENCOUNTER — Encounter: Payer: Self-pay | Admitting: Family Medicine

## 2022-10-10 MED ORDER — TRIAMCINOLONE ACETONIDE 0.1 % EX CREA: 1.0000 | TOPICAL_CREAM | Freq: Two times a day (BID) | CUTANEOUS | 2 refills | Status: AC

## 2022-10-10 NOTE — Telephone Encounter (Signed)
I sent in the RX

## 2022-10-11 ENCOUNTER — Ambulatory Visit
Admission: RE | Admit: 2022-10-11 | Discharge: 2022-10-11 | Disposition: A | Discharge status: Home / Self Care | Payer: BC Managed Care – PPO | Source: Ambulatory Visit | Attending: Obstetrics and Gynecology | Admitting: Obstetrics and Gynecology

## 2022-10-11 DIAGNOSIS — Z1239 Encounter for other screening for malignant neoplasm of breast: Secondary | ICD-10-CM | POA: Diagnosis not present

## 2022-10-11 DIAGNOSIS — Z803 Family history of malignant neoplasm of breast: Secondary | ICD-10-CM

## 2022-10-11 MED ORDER — GADOPICLENOL 0.5 MMOL/ML IV SOLN
9.0000 mL | Freq: Once | INTRAVENOUS | Status: AC | PRN
  Administered 2022-10-11: 9 mL via INTRAVENOUS

## 2022-10-19 ENCOUNTER — Encounter: Payer: Self-pay | Admitting: Radiology

## 2022-12-05 DIAGNOSIS — D0359 Melanoma in situ of other part of trunk: Secondary | ICD-10-CM | POA: Diagnosis not present

## 2022-12-05 DIAGNOSIS — Z7689 Persons encountering health services in other specified circumstances: Secondary | ICD-10-CM | POA: Diagnosis not present

## 2022-12-05 DIAGNOSIS — J3089 Other allergic rhinitis: Secondary | ICD-10-CM | POA: Diagnosis not present

## 2022-12-05 DIAGNOSIS — R6889 Other general symptoms and signs: Secondary | ICD-10-CM | POA: Diagnosis not present

## 2022-12-05 DIAGNOSIS — E039 Hypothyroidism, unspecified: Secondary | ICD-10-CM | POA: Diagnosis not present

## 2022-12-05 DIAGNOSIS — R062 Wheezing: Secondary | ICD-10-CM | POA: Diagnosis not present

## 2022-12-08 DIAGNOSIS — J40 Bronchitis, not specified as acute or chronic: Secondary | ICD-10-CM | POA: Diagnosis not present

## 2023-02-28 DIAGNOSIS — E782 Mixed hyperlipidemia: Secondary | ICD-10-CM | POA: Diagnosis not present

## 2023-02-28 DIAGNOSIS — E039 Hypothyroidism, unspecified: Secondary | ICD-10-CM | POA: Diagnosis not present

## 2023-02-28 DIAGNOSIS — Z1231 Encounter for screening mammogram for malignant neoplasm of breast: Secondary | ICD-10-CM | POA: Diagnosis not present

## 2023-02-28 DIAGNOSIS — E559 Vitamin D deficiency, unspecified: Secondary | ICD-10-CM | POA: Diagnosis not present

## 2023-02-28 DIAGNOSIS — K52832 Lymphocytic colitis: Secondary | ICD-10-CM | POA: Diagnosis not present

## 2023-02-28 DIAGNOSIS — Z1211 Encounter for screening for malignant neoplasm of colon: Secondary | ICD-10-CM | POA: Diagnosis not present

## 2023-02-28 DIAGNOSIS — N959 Unspecified menopausal and perimenopausal disorder: Secondary | ICD-10-CM | POA: Diagnosis not present

## 2023-03-12 DIAGNOSIS — M25571 Pain in right ankle and joints of right foot: Secondary | ICD-10-CM | POA: Diagnosis not present

## 2023-03-12 DIAGNOSIS — M722 Plantar fascial fibromatosis: Secondary | ICD-10-CM | POA: Diagnosis not present

## 2023-03-12 DIAGNOSIS — M7731 Calcaneal spur, right foot: Secondary | ICD-10-CM | POA: Diagnosis not present

## 2023-03-14 DIAGNOSIS — R7309 Other abnormal glucose: Secondary | ICD-10-CM | POA: Diagnosis not present

## 2023-03-14 DIAGNOSIS — E782 Mixed hyperlipidemia: Secondary | ICD-10-CM | POA: Diagnosis not present

## 2023-03-14 DIAGNOSIS — E559 Vitamin D deficiency, unspecified: Secondary | ICD-10-CM | POA: Diagnosis not present

## 2023-03-14 DIAGNOSIS — E039 Hypothyroidism, unspecified: Secondary | ICD-10-CM | POA: Diagnosis not present

## 2023-03-16 DIAGNOSIS — M7731 Calcaneal spur, right foot: Secondary | ICD-10-CM | POA: Diagnosis not present

## 2023-03-16 DIAGNOSIS — M79671 Pain in right foot: Secondary | ICD-10-CM | POA: Diagnosis not present

## 2023-03-16 DIAGNOSIS — M722 Plantar fascial fibromatosis: Secondary | ICD-10-CM | POA: Diagnosis not present

## 2023-03-21 DIAGNOSIS — L659 Nonscarring hair loss, unspecified: Secondary | ICD-10-CM | POA: Diagnosis not present

## 2023-03-21 DIAGNOSIS — E782 Mixed hyperlipidemia: Secondary | ICD-10-CM | POA: Diagnosis not present

## 2023-03-21 DIAGNOSIS — Z0000 Encounter for general adult medical examination without abnormal findings: Secondary | ICD-10-CM | POA: Diagnosis not present

## 2023-03-21 DIAGNOSIS — Z124 Encounter for screening for malignant neoplasm of cervix: Secondary | ICD-10-CM | POA: Diagnosis not present

## 2023-03-21 DIAGNOSIS — Z8619 Personal history of other infectious and parasitic diseases: Secondary | ICD-10-CM | POA: Diagnosis not present

## 2023-03-21 DIAGNOSIS — Z1211 Encounter for screening for malignant neoplasm of colon: Secondary | ICD-10-CM | POA: Diagnosis not present

## 2023-03-21 DIAGNOSIS — Z1231 Encounter for screening mammogram for malignant neoplasm of breast: Secondary | ICD-10-CM | POA: Diagnosis not present

## 2023-03-21 DIAGNOSIS — E039 Hypothyroidism, unspecified: Secondary | ICD-10-CM | POA: Diagnosis not present

## 2023-03-22 DIAGNOSIS — M79671 Pain in right foot: Secondary | ICD-10-CM | POA: Diagnosis not present

## 2023-03-22 DIAGNOSIS — M722 Plantar fascial fibromatosis: Secondary | ICD-10-CM | POA: Diagnosis not present

## 2023-03-22 DIAGNOSIS — M7731 Calcaneal spur, right foot: Secondary | ICD-10-CM | POA: Diagnosis not present

## 2023-03-27 DIAGNOSIS — R41840 Attention and concentration deficit: Secondary | ICD-10-CM | POA: Diagnosis not present

## 2023-03-27 DIAGNOSIS — F4323 Adjustment disorder with mixed anxiety and depressed mood: Secondary | ICD-10-CM | POA: Diagnosis not present

## 2023-03-28 DIAGNOSIS — M7731 Calcaneal spur, right foot: Secondary | ICD-10-CM | POA: Diagnosis not present

## 2023-03-28 DIAGNOSIS — M722 Plantar fascial fibromatosis: Secondary | ICD-10-CM | POA: Diagnosis not present

## 2023-03-28 DIAGNOSIS — M79671 Pain in right foot: Secondary | ICD-10-CM | POA: Diagnosis not present

## 2023-03-30 DIAGNOSIS — M722 Plantar fascial fibromatosis: Secondary | ICD-10-CM | POA: Diagnosis not present

## 2023-03-30 DIAGNOSIS — M7731 Calcaneal spur, right foot: Secondary | ICD-10-CM | POA: Diagnosis not present

## 2023-03-30 DIAGNOSIS — M79671 Pain in right foot: Secondary | ICD-10-CM | POA: Diagnosis not present

## 2023-04-04 DIAGNOSIS — M7731 Calcaneal spur, right foot: Secondary | ICD-10-CM | POA: Diagnosis not present

## 2023-04-04 DIAGNOSIS — M79671 Pain in right foot: Secondary | ICD-10-CM | POA: Diagnosis not present

## 2023-04-04 DIAGNOSIS — M722 Plantar fascial fibromatosis: Secondary | ICD-10-CM | POA: Diagnosis not present

## 2023-04-06 DIAGNOSIS — M7731 Calcaneal spur, right foot: Secondary | ICD-10-CM | POA: Diagnosis not present

## 2023-04-06 DIAGNOSIS — M722 Plantar fascial fibromatosis: Secondary | ICD-10-CM | POA: Diagnosis not present

## 2023-04-06 DIAGNOSIS — M79671 Pain in right foot: Secondary | ICD-10-CM | POA: Diagnosis not present

## 2023-04-10 DIAGNOSIS — M7731 Calcaneal spur, right foot: Secondary | ICD-10-CM | POA: Diagnosis not present

## 2023-04-10 DIAGNOSIS — M722 Plantar fascial fibromatosis: Secondary | ICD-10-CM | POA: Diagnosis not present

## 2023-04-10 DIAGNOSIS — M79671 Pain in right foot: Secondary | ICD-10-CM | POA: Diagnosis not present

## 2023-04-12 DIAGNOSIS — M79671 Pain in right foot: Secondary | ICD-10-CM | POA: Diagnosis not present

## 2023-04-12 DIAGNOSIS — M722 Plantar fascial fibromatosis: Secondary | ICD-10-CM | POA: Diagnosis not present

## 2023-04-12 DIAGNOSIS — M7731 Calcaneal spur, right foot: Secondary | ICD-10-CM | POA: Diagnosis not present

## 2023-04-17 DIAGNOSIS — L814 Other melanin hyperpigmentation: Secondary | ICD-10-CM | POA: Diagnosis not present

## 2023-04-17 DIAGNOSIS — M7731 Calcaneal spur, right foot: Secondary | ICD-10-CM | POA: Diagnosis not present

## 2023-04-17 DIAGNOSIS — L821 Other seborrheic keratosis: Secondary | ICD-10-CM | POA: Diagnosis not present

## 2023-04-17 DIAGNOSIS — M79671 Pain in right foot: Secondary | ICD-10-CM | POA: Diagnosis not present

## 2023-04-17 DIAGNOSIS — L918 Other hypertrophic disorders of the skin: Secondary | ICD-10-CM | POA: Diagnosis not present

## 2023-04-17 DIAGNOSIS — M722 Plantar fascial fibromatosis: Secondary | ICD-10-CM | POA: Diagnosis not present

## 2023-04-17 DIAGNOSIS — L2089 Other atopic dermatitis: Secondary | ICD-10-CM | POA: Diagnosis not present

## 2023-04-19 DIAGNOSIS — M722 Plantar fascial fibromatosis: Secondary | ICD-10-CM | POA: Diagnosis not present

## 2023-04-19 DIAGNOSIS — M7731 Calcaneal spur, right foot: Secondary | ICD-10-CM | POA: Diagnosis not present

## 2023-04-19 DIAGNOSIS — M79671 Pain in right foot: Secondary | ICD-10-CM | POA: Diagnosis not present

## 2023-04-24 DIAGNOSIS — M7731 Calcaneal spur, right foot: Secondary | ICD-10-CM | POA: Diagnosis not present

## 2023-04-24 DIAGNOSIS — M722 Plantar fascial fibromatosis: Secondary | ICD-10-CM | POA: Diagnosis not present

## 2023-04-24 DIAGNOSIS — M79671 Pain in right foot: Secondary | ICD-10-CM | POA: Diagnosis not present

## 2023-04-26 DIAGNOSIS — M7731 Calcaneal spur, right foot: Secondary | ICD-10-CM | POA: Diagnosis not present

## 2023-04-26 DIAGNOSIS — M722 Plantar fascial fibromatosis: Secondary | ICD-10-CM | POA: Diagnosis not present

## 2023-04-26 DIAGNOSIS — M79671 Pain in right foot: Secondary | ICD-10-CM | POA: Diagnosis not present

## 2023-05-01 DIAGNOSIS — M722 Plantar fascial fibromatosis: Secondary | ICD-10-CM | POA: Diagnosis not present

## 2023-05-01 DIAGNOSIS — M7731 Calcaneal spur, right foot: Secondary | ICD-10-CM | POA: Diagnosis not present

## 2023-05-01 DIAGNOSIS — M79671 Pain in right foot: Secondary | ICD-10-CM | POA: Diagnosis not present

## 2023-05-03 DIAGNOSIS — M79671 Pain in right foot: Secondary | ICD-10-CM | POA: Diagnosis not present

## 2023-05-03 DIAGNOSIS — M722 Plantar fascial fibromatosis: Secondary | ICD-10-CM | POA: Diagnosis not present

## 2023-05-03 DIAGNOSIS — M7731 Calcaneal spur, right foot: Secondary | ICD-10-CM | POA: Diagnosis not present

## 2023-05-04 DIAGNOSIS — Z124 Encounter for screening for malignant neoplasm of cervix: Secondary | ICD-10-CM | POA: Diagnosis not present

## 2023-05-04 DIAGNOSIS — Z1231 Encounter for screening mammogram for malignant neoplasm of breast: Secondary | ICD-10-CM | POA: Diagnosis not present

## 2023-05-04 DIAGNOSIS — N951 Menopausal and female climacteric states: Secondary | ICD-10-CM | POA: Diagnosis not present

## 2023-05-04 DIAGNOSIS — Z01419 Encounter for gynecological examination (general) (routine) without abnormal findings: Secondary | ICD-10-CM | POA: Diagnosis not present

## 2023-05-07 DIAGNOSIS — M79671 Pain in right foot: Secondary | ICD-10-CM | POA: Diagnosis not present

## 2023-05-07 DIAGNOSIS — M722 Plantar fascial fibromatosis: Secondary | ICD-10-CM | POA: Diagnosis not present

## 2023-05-07 DIAGNOSIS — M7731 Calcaneal spur, right foot: Secondary | ICD-10-CM | POA: Diagnosis not present

## 2023-05-10 DIAGNOSIS — M722 Plantar fascial fibromatosis: Secondary | ICD-10-CM | POA: Diagnosis not present

## 2023-05-10 DIAGNOSIS — M7731 Calcaneal spur, right foot: Secondary | ICD-10-CM | POA: Diagnosis not present

## 2023-05-10 DIAGNOSIS — M79671 Pain in right foot: Secondary | ICD-10-CM | POA: Diagnosis not present

## 2023-05-15 DIAGNOSIS — M79671 Pain in right foot: Secondary | ICD-10-CM | POA: Diagnosis not present

## 2023-05-15 DIAGNOSIS — M722 Plantar fascial fibromatosis: Secondary | ICD-10-CM | POA: Diagnosis not present

## 2023-05-15 DIAGNOSIS — M7731 Calcaneal spur, right foot: Secondary | ICD-10-CM | POA: Diagnosis not present

## 2023-05-17 DIAGNOSIS — M7731 Calcaneal spur, right foot: Secondary | ICD-10-CM | POA: Diagnosis not present

## 2023-05-17 DIAGNOSIS — M722 Plantar fascial fibromatosis: Secondary | ICD-10-CM | POA: Diagnosis not present

## 2023-05-17 DIAGNOSIS — M79671 Pain in right foot: Secondary | ICD-10-CM | POA: Diagnosis not present

## 2023-06-04 IMAGING — MR MR BREAST BILAT WO/W CM
8 of 12 series · 33 of 48 positions shown · IV contrast (7 ml gadavist)
Comparison: 06/18/2020 MR, 01/25/2021 mammogram and prior studies.

CLINICAL DATA: 55-year-old female with high lifetime risk for
developing breast cancer, for screening breast MR.

EXAM:
BILATERAL BREAST MRI WITH AND WITHOUT CONTRAST
TECHNIQUE: Multiplanar, multisequence MR images of both breasts were obtained
prior to and following the intravenous administration of 7 ml of
Gadavist

[Series 2: t2_tirm_tra ipat (a-p) · axial · 3.0mm · 0.66mm/px · 1 of 55 slices shown]
[im 1/55]
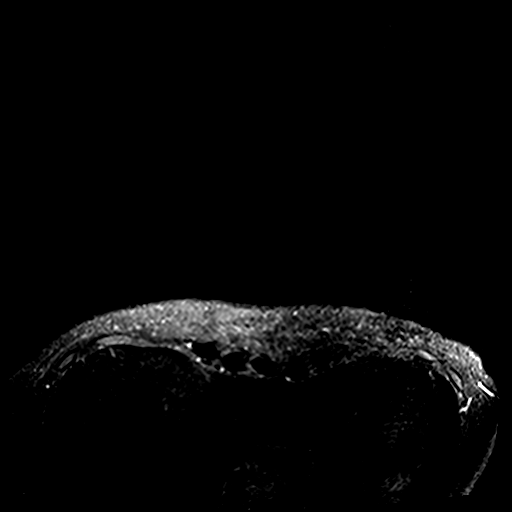

[Series 3: fl3d pre-cm no · axial · non-contrast · 1.2mm · 0.89mm/px · z∈[-103,+69]mm · 5 of 144 slices shown]
[im 1/144]
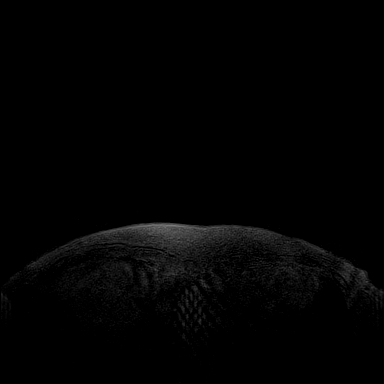
[im 36/144]
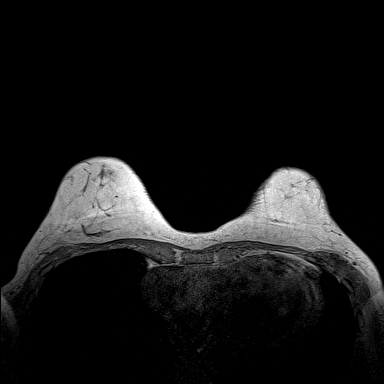
[im 72/144]
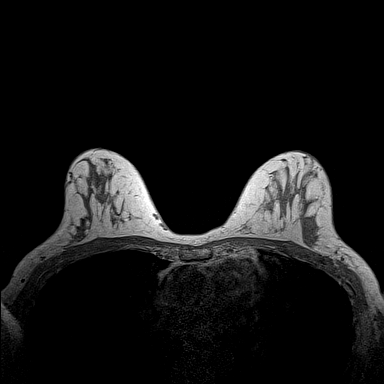
[im 108/144]
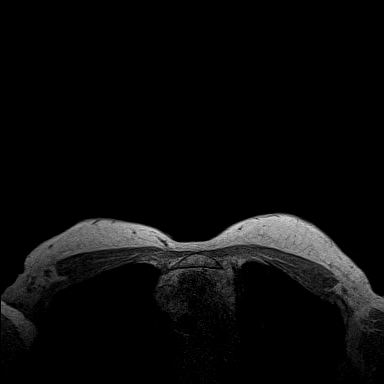
[im 144/144]
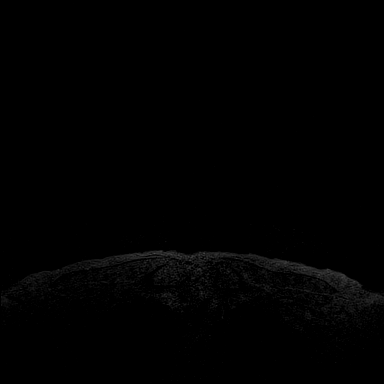

[Series 4: fl3d pre-cm · axial · non-contrast · 1.2mm · 0.89mm/px · z∈[-103,+69]mm · 5 of 144 slices shown]
[im 1/144]
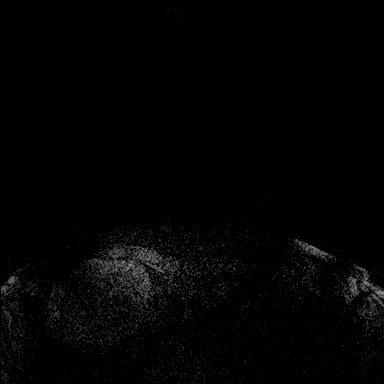
[im 36/144]
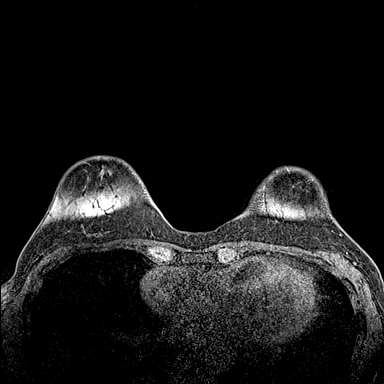
[im 72/144]
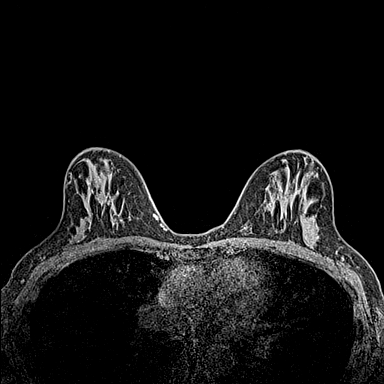
[im 108/144]
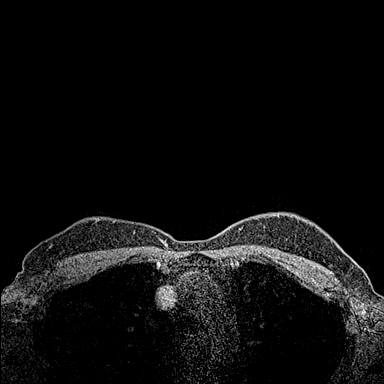
[im 144/144]
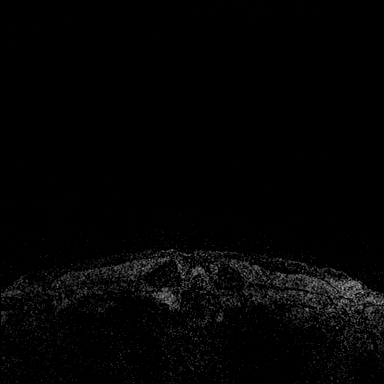

[Series 5: fl3d post-cm 20 · axial · 1.2mm · 0.89mm/px · z∈[-103,+69]mm · 5 of 144 slices shown (1 of 3)]
[im 1/144]
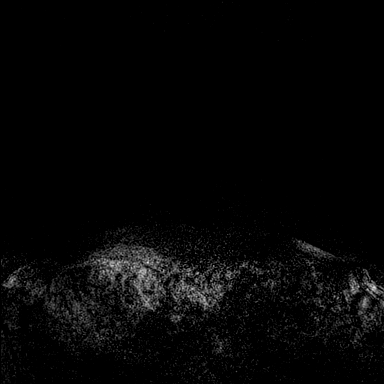
[im 36/144]
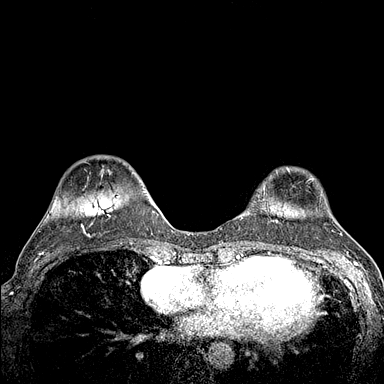
[im 72/144]
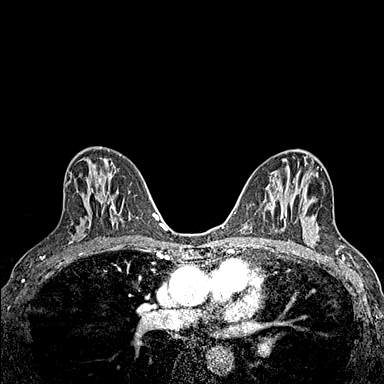
[im 108/144]
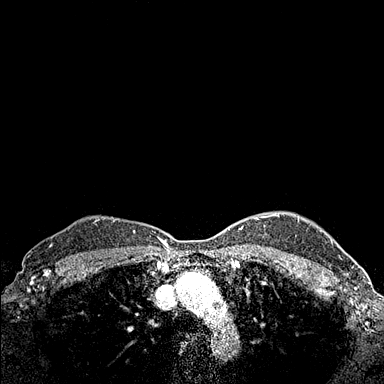
[im 144/144]
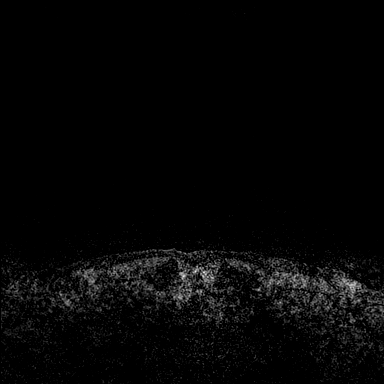

[Series 6: fl3d post-cm 20 · axial · 1.2mm · 0.89mm/px · z∈[-103,+69]mm · 5 of 144 slices shown (2 of 3)]
[im 1/144]
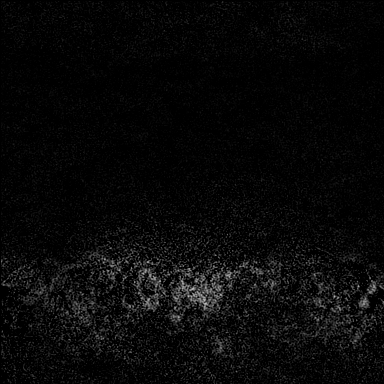
[im 36/144]
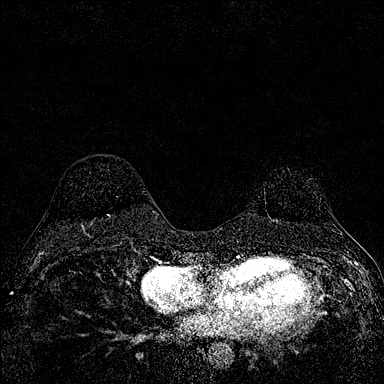
[im 72/144]
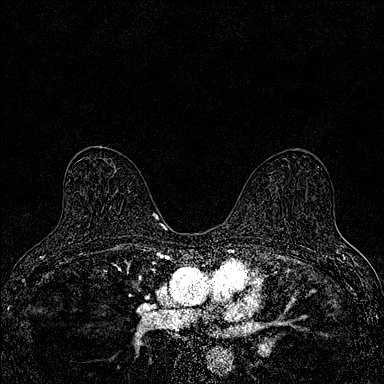
[im 108/144]
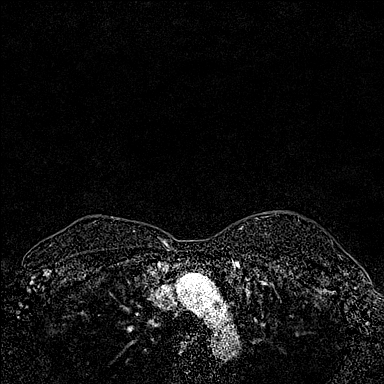
[im 144/144]
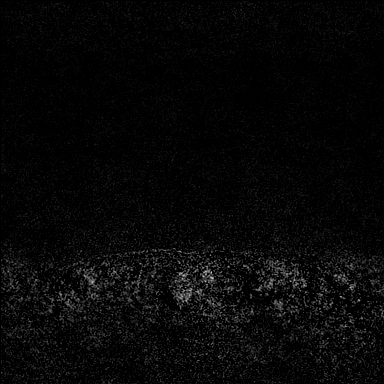

[Series 7: fl3d post-cm 20 · axial · 172.8mm · 0.89mm/px · 1 of 1 slices shown (3 of 3)]
[im 1/1]
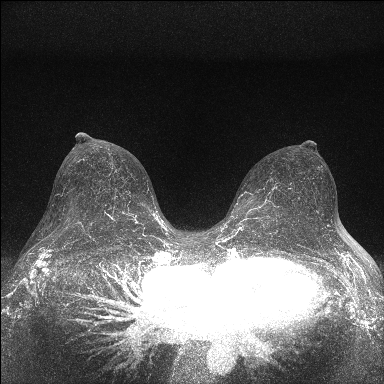

[Series 8: fl3d post-cm 3 · axial · 1.2mm · 0.89mm/px · z∈[-103,+69]mm · 6 of 144 slices shown (1 of 2)]
[im 1/144]
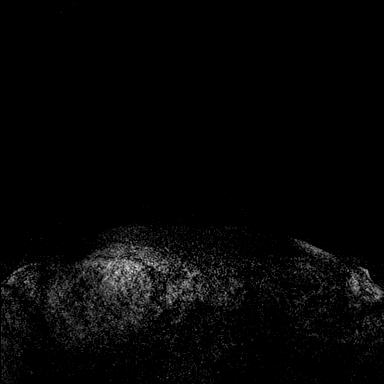
[im 29/144]
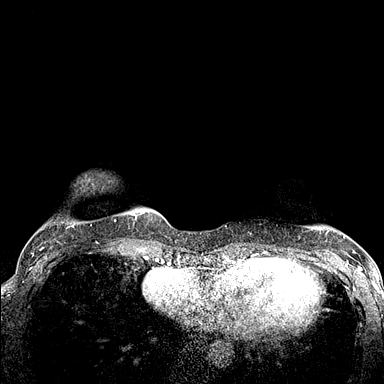
[im 58/144]
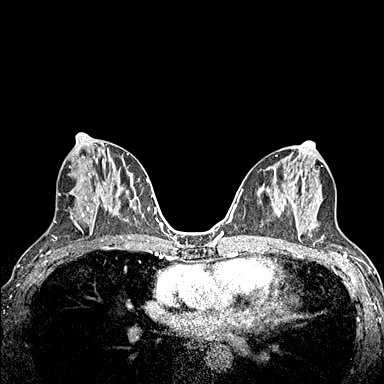
[im 86/144]
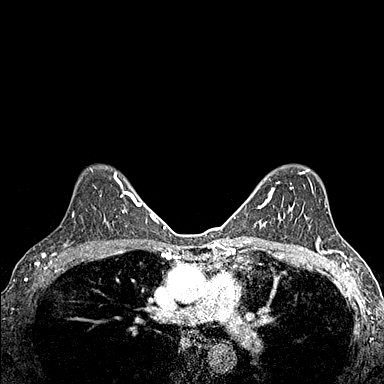
[im 115/144]
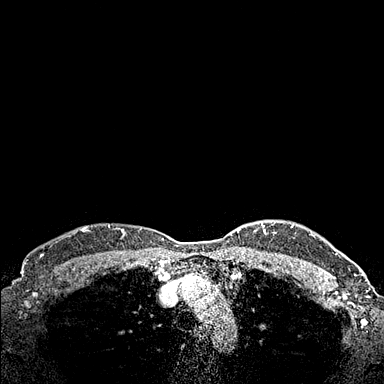
[im 144/144]
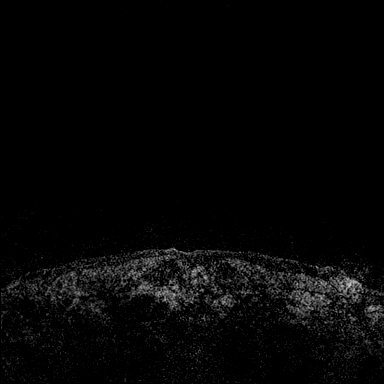

[Series 9: fl3d post-cm 3 · axial · 1.2mm · 0.89mm/px · z∈[-103,+34]mm · 5 of 144 slices shown (2 of 2)]
[im 1/144]
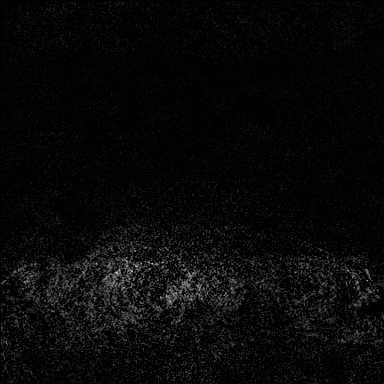
[im 29/144]
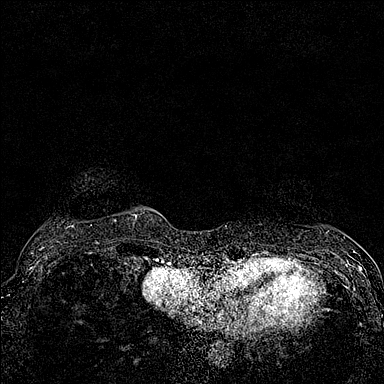
[im 58/144]
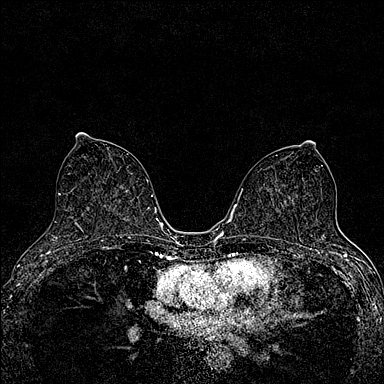
[im 86/144]
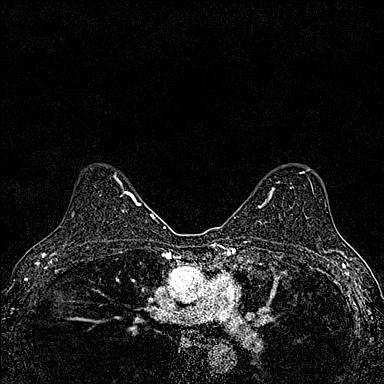
[im 115/144]
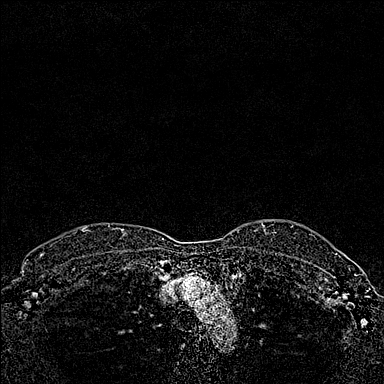

[33 of 48 positions shown; findings below may reference images not displayed]

Three-dimensional MR images were rendered by post-processing of the
original MR data on an independent workstation. The
three-dimensional MR images were interpreted, and findings are
reported in the following complete MRI report for this study. Three
dimensional images were evaluated at the independent interpreting
workstation using the [employer] thin client.
FINDINGS: Breast composition: c. Heterogeneous fibroglandular tissue.

Background parenchymal enhancement: Minimal

Right breast: No mass or abnormal enhancement.

Left breast: No mass or abnormal enhancement.

Lymph nodes: No abnormal appearing lymph nodes.

Ancillary findings:  None.
IMPRESSION: No MR evidence of breast malignancy.

RECOMMENDATION:
Bilateral screening mammogram in 3-4 months to resume annual
mammogram schedule.

Bilateral screening breast MRI in 1 year if the patient's lifetime
risk of developing breast cancer is greater than 20%.

BI-RADS CATEGORY  1: Negative.

## 2023-07-04 DIAGNOSIS — J018 Other acute sinusitis: Secondary | ICD-10-CM | POA: Diagnosis not present

## 2023-07-10 DIAGNOSIS — F419 Anxiety disorder, unspecified: Secondary | ICD-10-CM | POA: Diagnosis not present

## 2023-07-10 DIAGNOSIS — Z136 Encounter for screening for cardiovascular disorders: Secondary | ICD-10-CM | POA: Diagnosis not present

## 2023-07-30 DIAGNOSIS — N959 Unspecified menopausal and perimenopausal disorder: Secondary | ICD-10-CM | POA: Diagnosis not present

## 2023-07-30 DIAGNOSIS — E039 Hypothyroidism, unspecified: Secondary | ICD-10-CM | POA: Diagnosis not present

## 2023-08-03 DIAGNOSIS — N951 Menopausal and female climacteric states: Secondary | ICD-10-CM | POA: Diagnosis not present

## 2023-09-20 DIAGNOSIS — J069 Acute upper respiratory infection, unspecified: Secondary | ICD-10-CM | POA: Diagnosis not present

## 2023-09-20 DIAGNOSIS — R059 Cough, unspecified: Secondary | ICD-10-CM | POA: Diagnosis not present

## 2023-11-23 DIAGNOSIS — Z136 Encounter for screening for cardiovascular disorders: Secondary | ICD-10-CM | POA: Diagnosis not present

## 2024-01-16 DIAGNOSIS — M2550 Pain in unspecified joint: Secondary | ICD-10-CM | POA: Diagnosis not present

## 2024-01-16 DIAGNOSIS — E782 Mixed hyperlipidemia: Secondary | ICD-10-CM | POA: Diagnosis not present

## 2024-01-16 DIAGNOSIS — J302 Other seasonal allergic rhinitis: Secondary | ICD-10-CM | POA: Diagnosis not present

## 2024-01-16 DIAGNOSIS — R5383 Other fatigue: Secondary | ICD-10-CM | POA: Diagnosis not present

## 2024-02-14 DIAGNOSIS — Z83719 Family history of colon polyps, unspecified: Secondary | ICD-10-CM | POA: Diagnosis not present

## 2024-02-14 DIAGNOSIS — K52832 Lymphocytic colitis: Secondary | ICD-10-CM | POA: Diagnosis not present

## 2024-02-14 DIAGNOSIS — R197 Diarrhea, unspecified: Secondary | ICD-10-CM | POA: Diagnosis not present

## 2024-02-14 DIAGNOSIS — Z8619 Personal history of other infectious and parasitic diseases: Secondary | ICD-10-CM | POA: Diagnosis not present

## 2024-02-20 DIAGNOSIS — Z8619 Personal history of other infectious and parasitic diseases: Secondary | ICD-10-CM | POA: Diagnosis not present

## 2024-02-20 DIAGNOSIS — K52832 Lymphocytic colitis: Secondary | ICD-10-CM | POA: Diagnosis not present

## 2024-02-25 DIAGNOSIS — Z8619 Personal history of other infectious and parasitic diseases: Secondary | ICD-10-CM | POA: Diagnosis not present

## 2024-03-12 DIAGNOSIS — Z8719 Personal history of other diseases of the digestive system: Secondary | ICD-10-CM | POA: Diagnosis not present

## 2024-03-12 DIAGNOSIS — K5730 Diverticulosis of large intestine without perforation or abscess without bleeding: Secondary | ICD-10-CM | POA: Diagnosis not present

## 2024-03-12 DIAGNOSIS — R197 Diarrhea, unspecified: Secondary | ICD-10-CM | POA: Diagnosis not present

## 2024-03-12 DIAGNOSIS — E039 Hypothyroidism, unspecified: Secondary | ICD-10-CM | POA: Diagnosis not present

## 2024-03-12 DIAGNOSIS — K52832 Lymphocytic colitis: Secondary | ICD-10-CM | POA: Diagnosis not present

## 2024-04-21 DIAGNOSIS — L821 Other seborrheic keratosis: Secondary | ICD-10-CM | POA: Diagnosis not present

## 2024-04-21 DIAGNOSIS — L728 Other follicular cysts of the skin and subcutaneous tissue: Secondary | ICD-10-CM | POA: Diagnosis not present

## 2024-04-21 DIAGNOSIS — L816 Other disorders of diminished melanin formation: Secondary | ICD-10-CM | POA: Diagnosis not present

## 2024-04-21 DIAGNOSIS — D1801 Hemangioma of skin and subcutaneous tissue: Secondary | ICD-10-CM | POA: Diagnosis not present

## 2024-05-06 DIAGNOSIS — Z1231 Encounter for screening mammogram for malignant neoplasm of breast: Secondary | ICD-10-CM | POA: Diagnosis not present

## 2024-05-06 DIAGNOSIS — Z01419 Encounter for gynecological examination (general) (routine) without abnormal findings: Secondary | ICD-10-CM | POA: Diagnosis not present

## 2024-05-06 DIAGNOSIS — R87610 Atypical squamous cells of undetermined significance on cytologic smear of cervix (ASC-US): Secondary | ICD-10-CM | POA: Diagnosis not present

## 2024-05-12 DIAGNOSIS — E782 Mixed hyperlipidemia: Secondary | ICD-10-CM | POA: Diagnosis not present

## 2024-05-12 DIAGNOSIS — K52832 Lymphocytic colitis: Secondary | ICD-10-CM | POA: Diagnosis not present

## 2024-05-12 DIAGNOSIS — L309 Dermatitis, unspecified: Secondary | ICD-10-CM | POA: Diagnosis not present

## 2024-05-12 DIAGNOSIS — R5383 Other fatigue: Secondary | ICD-10-CM | POA: Diagnosis not present

## 2024-05-12 DIAGNOSIS — K8681 Exocrine pancreatic insufficiency: Secondary | ICD-10-CM | POA: Diagnosis not present

## 2024-05-12 DIAGNOSIS — M2550 Pain in unspecified joint: Secondary | ICD-10-CM | POA: Diagnosis not present

## 2024-05-12 DIAGNOSIS — I10 Essential (primary) hypertension: Secondary | ICD-10-CM | POA: Diagnosis not present

## 2024-05-15 DIAGNOSIS — K52832 Lymphocytic colitis: Secondary | ICD-10-CM | POA: Diagnosis not present

## 2024-05-15 DIAGNOSIS — R109 Unspecified abdominal pain: Secondary | ICD-10-CM | POA: Diagnosis not present

## 2024-05-15 DIAGNOSIS — Z83719 Family history of colon polyps, unspecified: Secondary | ICD-10-CM | POA: Diagnosis not present

## 2024-05-15 DIAGNOSIS — Z8619 Personal history of other infectious and parasitic diseases: Secondary | ICD-10-CM | POA: Diagnosis not present

## 2024-05-15 DIAGNOSIS — K8681 Exocrine pancreatic insufficiency: Secondary | ICD-10-CM | POA: Diagnosis not present

## 2024-06-23 DIAGNOSIS — Z13820 Encounter for screening for osteoporosis: Secondary | ICD-10-CM | POA: Diagnosis not present

## 2024-07-01 DIAGNOSIS — J069 Acute upper respiratory infection, unspecified: Secondary | ICD-10-CM | POA: Diagnosis not present
